# Patient Record
Sex: Male | Born: 1945 | Race: White | Hispanic: No | Marital: Married | Smoking: Never smoker
Health system: Southern US, Community
[De-identification: ages and names within clinical notes are randomized; demographics above are authoritative.]

## PROBLEM LIST (undated history)

## (undated) DIAGNOSIS — J45909 Unspecified asthma, uncomplicated: Secondary | ICD-10-CM

## (undated) DIAGNOSIS — M1990 Unspecified osteoarthritis, unspecified site: Secondary | ICD-10-CM

## (undated) DIAGNOSIS — C4491 Basal cell carcinoma of skin, unspecified: Secondary | ICD-10-CM

## (undated) DIAGNOSIS — L0390 Cellulitis, unspecified: Secondary | ICD-10-CM

## (undated) DIAGNOSIS — E782 Mixed hyperlipidemia: Secondary | ICD-10-CM

## (undated) DIAGNOSIS — I10 Essential (primary) hypertension: Secondary | ICD-10-CM

## (undated) DIAGNOSIS — M791 Myalgia, unspecified site: Secondary | ICD-10-CM

## (undated) DIAGNOSIS — M48062 Spinal stenosis, lumbar region with neurogenic claudication: Secondary | ICD-10-CM

## (undated) DIAGNOSIS — G8929 Other chronic pain: Secondary | ICD-10-CM

## (undated) DIAGNOSIS — L089 Local infection of the skin and subcutaneous tissue, unspecified: Secondary | ICD-10-CM

## (undated) DIAGNOSIS — E119 Type 2 diabetes mellitus without complications: Secondary | ICD-10-CM

## (undated) HISTORY — PX: PILONIDAL CYST EXCISION: SHX744

## (undated) HISTORY — PX: HERNIA REPAIR: SHX51

## (undated) HISTORY — PX: TOTAL HIP ARTHROPLASTY: SHX124

## (undated) HISTORY — PX: TONSILLECTOMY: SUR1361

## (undated) HISTORY — PX: COLONOSCOPY: SHX174

---

## 2004-03-21 ENCOUNTER — Inpatient Hospital Stay: Payer: Self-pay | Admitting: Internal Medicine

## 2004-03-21 ENCOUNTER — Other Ambulatory Visit: Payer: Self-pay

## 2005-09-01 ENCOUNTER — Ambulatory Visit: Payer: Self-pay

## 2006-01-03 ENCOUNTER — Ambulatory Visit: Payer: Self-pay | Admitting: Internal Medicine

## 2009-02-21 ENCOUNTER — Ambulatory Visit: Payer: Self-pay | Admitting: Orthopedic Surgery

## 2009-03-06 ENCOUNTER — Ambulatory Visit: Payer: Self-pay | Admitting: Unknown Physician Specialty

## 2009-05-13 ENCOUNTER — Ambulatory Visit: Payer: Self-pay | Admitting: Unknown Physician Specialty

## 2009-05-19 ENCOUNTER — Inpatient Hospital Stay: Payer: Self-pay | Admitting: Unknown Physician Specialty

## 2010-07-21 IMAGING — RF DG ASPIRATION/ INJ LARGE JOINT
1 series · 1 of 1 positions shown · non-contrast
Comparison: none

REASON FOR EXAM: right hip pain
COMMENTS:

PROCEDURE:     FL  - FL HIP INJECTION  - March 06, 2009  [DATE]
RESULT:     Indication: Right hip pain
Comparisons: No comparison
TECHNIQUE: After informed consent was obtained explaining the risks, benefits, and
possible complications of the procedure, the patient was placed supine on
the fluoroscopy table. A formal time procedure was performed according to
department of protocol.

[Series 1: run · 1 of 1 slices shown]
[im 1/1]
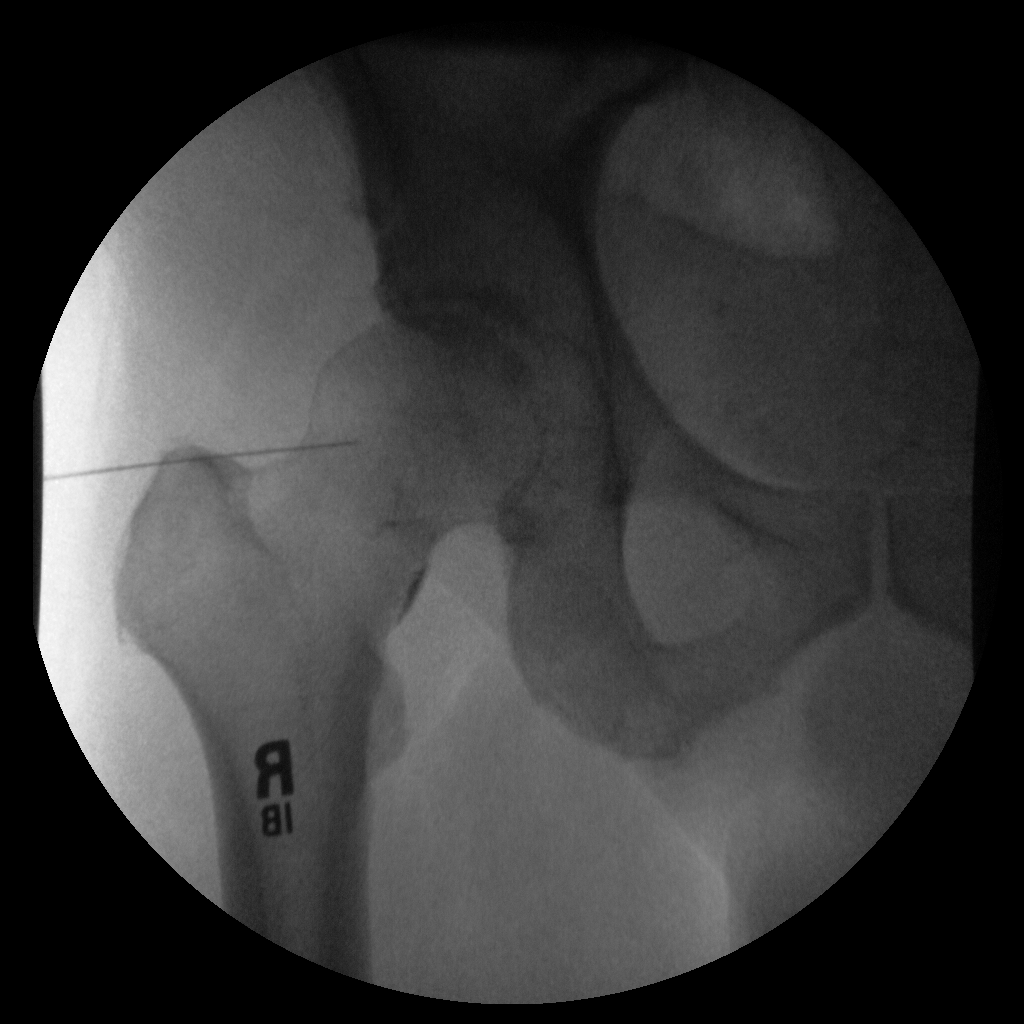

[1 of 1 positions shown; findings below may reference images not displayed]

The right hip was prepped and draped in the usual sterile fashion. 1%
lidocaine was used to anesthetize the skin. A 22 gauge spinal needle was
advanced into the joint space under fluoroscopic guidance. Less than 1ml of
Sptiray-S2P was administered to confirm intra-articular position under
fluoroscopic guidance.

Subsequently, 7 ml of Naropin and 1ml (40 mg) of Kenalog were injected into
the right hip joint space.

Hemostasis was achieved. Patient tolerated the procedure well. There were no
immediate complications.
IMPRESSION: Successful right hip injection as above.

## 2010-10-03 IMAGING — CR RIGHT HIP - 1 VIEW
1 series · 1 of 1 positions shown · non-contrast
Comparison: none

REASON FOR EXAM: post op THR
COMMENTS:   Bedside (portable):Y

[view not recorded]
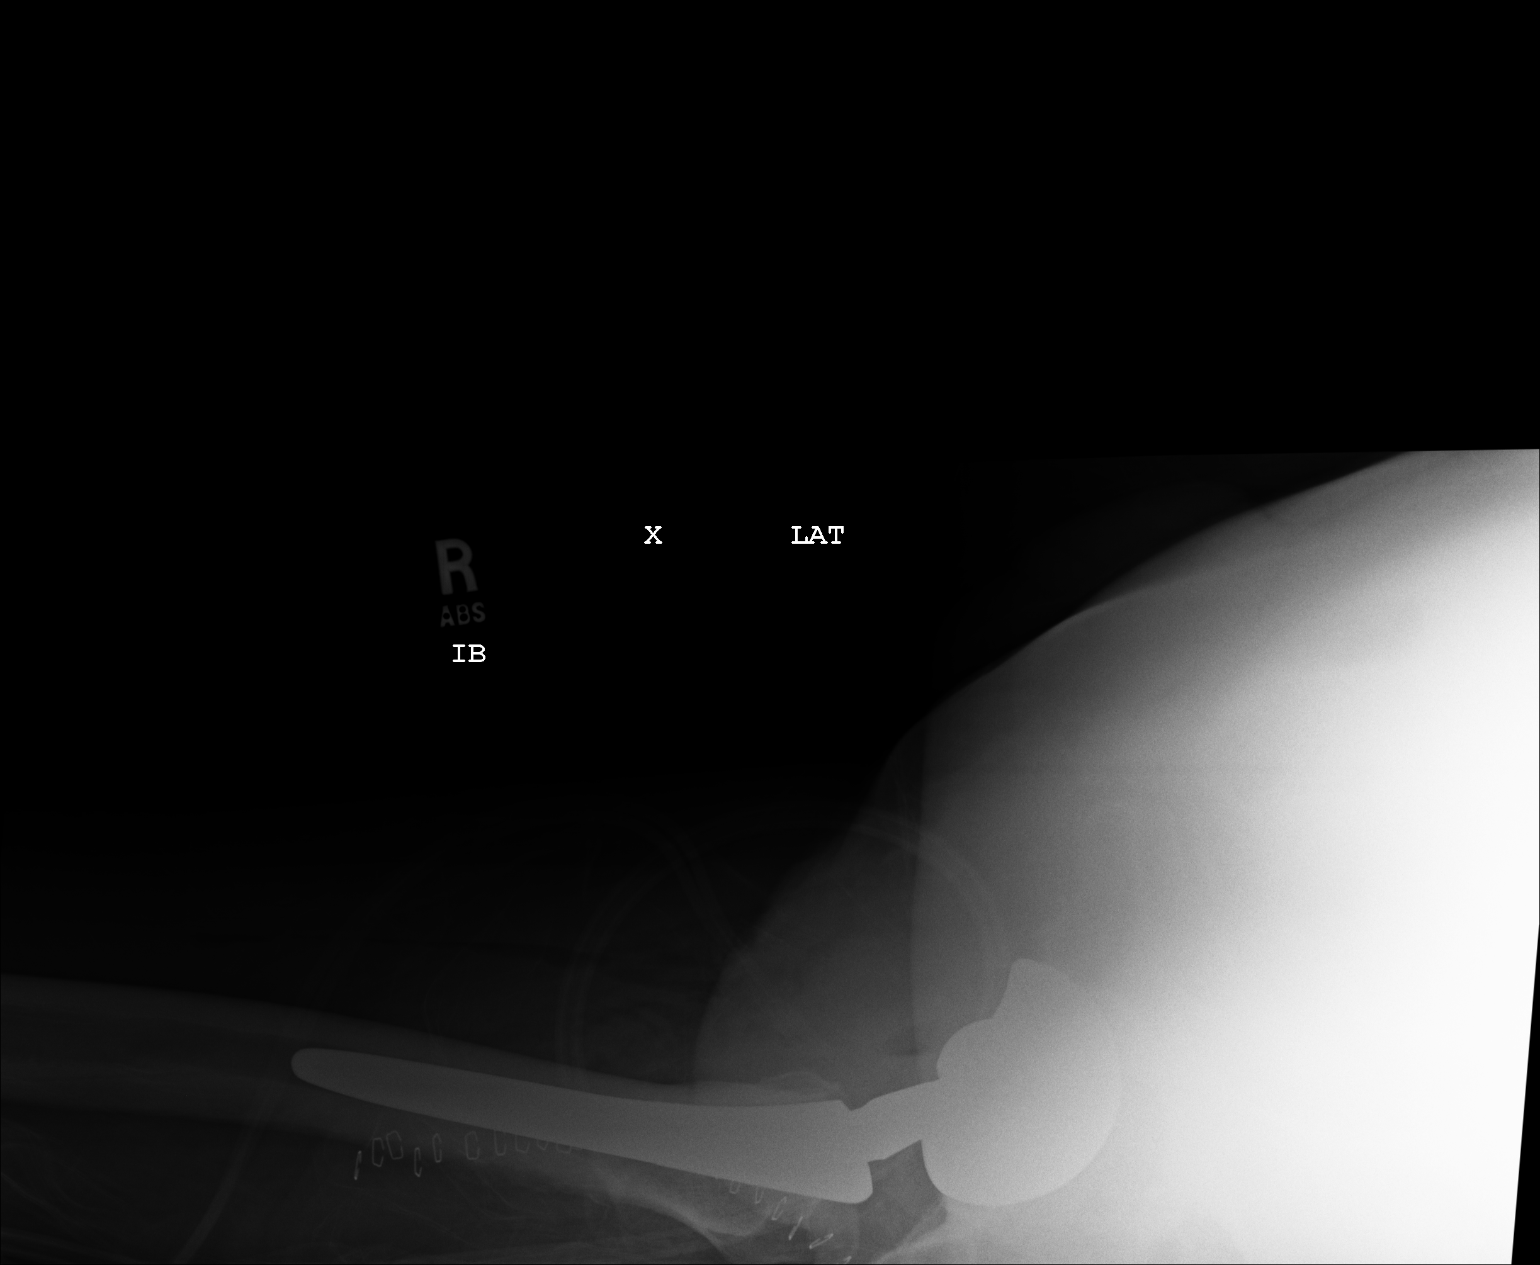

[1 of 1 positions shown; findings below may reference images not displayed]

PROCEDURE:     DXR - DXR HIP RIGHT ONE VIEW  - May 19, 2009 [DATE]

RESULT:     A crosstable lateral view of the right hip reveals that the
patient has undergone prosthetic joint placement. Radiographic positioning
of the prosthetic components is good. Orthopedic drain lines and skin
staples are present.
IMPRESSION: The patient has undergone right hip joint replacement.
Further interpretation is deferred to Dr. Sadiel Jose.

## 2013-03-22 ENCOUNTER — Ambulatory Visit: Payer: Self-pay | Admitting: Gastroenterology

## 2016-03-07 ENCOUNTER — Encounter (INDEPENDENT_AMBULATORY_CARE_PROVIDER_SITE_OTHER): Payer: Medicare Other | Admitting: Ophthalmology

## 2016-03-07 DIAGNOSIS — E113293 Type 2 diabetes mellitus with mild nonproliferative diabetic retinopathy without macular edema, bilateral: Secondary | ICD-10-CM

## 2016-03-07 DIAGNOSIS — I10 Essential (primary) hypertension: Secondary | ICD-10-CM

## 2016-03-07 DIAGNOSIS — H35033 Hypertensive retinopathy, bilateral: Secondary | ICD-10-CM

## 2016-03-07 DIAGNOSIS — E11319 Type 2 diabetes mellitus with unspecified diabetic retinopathy without macular edema: Secondary | ICD-10-CM | POA: Diagnosis not present

## 2016-03-07 DIAGNOSIS — H2513 Age-related nuclear cataract, bilateral: Secondary | ICD-10-CM | POA: Diagnosis not present

## 2016-03-07 DIAGNOSIS — H35371 Puckering of macula, right eye: Secondary | ICD-10-CM

## 2016-03-07 DIAGNOSIS — H43813 Vitreous degeneration, bilateral: Secondary | ICD-10-CM | POA: Diagnosis not present

## 2016-09-12 ENCOUNTER — Ambulatory Visit (INDEPENDENT_AMBULATORY_CARE_PROVIDER_SITE_OTHER): Payer: Medicare Other | Admitting: Ophthalmology

## 2016-09-26 ENCOUNTER — Ambulatory Visit (INDEPENDENT_AMBULATORY_CARE_PROVIDER_SITE_OTHER): Payer: Medicare Other | Admitting: Ophthalmology

## 2019-10-04 ENCOUNTER — Other Ambulatory Visit: Payer: Self-pay | Admitting: Physical Medicine & Rehabilitation

## 2019-10-04 DIAGNOSIS — M5416 Radiculopathy, lumbar region: Secondary | ICD-10-CM

## 2019-10-16 ENCOUNTER — Ambulatory Visit
Admission: RE | Admit: 2019-10-16 | Discharge: 2019-10-16 | Disposition: A | Discharge status: Home / Self Care | Payer: Medicare Other | Source: Ambulatory Visit | Attending: Physical Medicine & Rehabilitation | Admitting: Physical Medicine & Rehabilitation

## 2019-10-16 ENCOUNTER — Other Ambulatory Visit: Payer: Self-pay

## 2019-10-16 DIAGNOSIS — M5416 Radiculopathy, lumbar region: Secondary | ICD-10-CM | POA: Insufficient documentation

## 2021-01-27 ENCOUNTER — Other Ambulatory Visit: Payer: Self-pay | Admitting: Physical Medicine & Rehabilitation

## 2021-01-27 DIAGNOSIS — M48062 Spinal stenosis, lumbar region with neurogenic claudication: Secondary | ICD-10-CM

## 2021-02-09 ENCOUNTER — Other Ambulatory Visit: Payer: Self-pay

## 2021-02-09 ENCOUNTER — Ambulatory Visit
Admission: RE | Admit: 2021-02-09 | Discharge: 2021-02-09 | Disposition: A | Discharge status: Home / Self Care | Payer: Medicare Other | Source: Ambulatory Visit | Attending: Physical Medicine & Rehabilitation | Admitting: Physical Medicine & Rehabilitation

## 2021-02-09 DIAGNOSIS — M48062 Spinal stenosis, lumbar region with neurogenic claudication: Secondary | ICD-10-CM | POA: Diagnosis not present

## 2021-03-25 ENCOUNTER — Other Ambulatory Visit: Payer: Self-pay | Admitting: Neurosurgery

## 2021-04-07 ENCOUNTER — Other Ambulatory Visit: Payer: Self-pay

## 2021-04-07 ENCOUNTER — Other Ambulatory Visit
Admission: RE | Admit: 2021-04-07 | Discharge: 2021-04-07 | Disposition: A | Discharge status: Home / Self Care | Payer: Medicare Other | Source: Ambulatory Visit | Attending: Neurosurgery | Admitting: Neurosurgery

## 2021-04-07 DIAGNOSIS — Z01818 Encounter for other preprocedural examination: Secondary | ICD-10-CM | POA: Insufficient documentation

## 2021-04-07 DIAGNOSIS — I10 Essential (primary) hypertension: Secondary | ICD-10-CM | POA: Diagnosis not present

## 2021-04-07 DIAGNOSIS — Z01810 Encounter for preprocedural cardiovascular examination: Secondary | ICD-10-CM | POA: Diagnosis not present

## 2021-04-07 DIAGNOSIS — M48062 Spinal stenosis, lumbar region with neurogenic claudication: Secondary | ICD-10-CM | POA: Diagnosis not present

## 2021-04-07 DIAGNOSIS — Z79899 Other long term (current) drug therapy: Secondary | ICD-10-CM | POA: Insufficient documentation

## 2021-04-07 DIAGNOSIS — E119 Type 2 diabetes mellitus without complications: Secondary | ICD-10-CM | POA: Insufficient documentation

## 2021-04-07 DIAGNOSIS — Z7984 Long term (current) use of oral hypoglycemic drugs: Secondary | ICD-10-CM | POA: Diagnosis not present

## 2021-04-07 HISTORY — DX: Cellulitis, unspecified: L03.90

## 2021-04-07 HISTORY — DX: Spinal stenosis, lumbar region with neurogenic claudication: M48.062

## 2021-04-07 HISTORY — DX: Other chronic pain: G89.29

## 2021-04-07 HISTORY — DX: Basal cell carcinoma of skin, unspecified: C44.91

## 2021-04-07 HISTORY — DX: Myalgia, unspecified site: M79.10

## 2021-04-07 HISTORY — DX: Mixed hyperlipidemia: E78.2

## 2021-04-07 HISTORY — DX: Essential (primary) hypertension: I10

## 2021-04-07 HISTORY — DX: Local infection of the skin and subcutaneous tissue, unspecified: L08.9

## 2021-04-07 HISTORY — DX: Type 2 diabetes mellitus without complications: E11.9

## 2021-04-07 LAB — URINALYSIS, ROUTINE W REFLEX MICROSCOPIC
Bilirubin Urine: NEGATIVE
Glucose, UA: 50 mg/dL — AB
Hgb urine dipstick: NEGATIVE
Ketones, ur: NEGATIVE mg/dL
Leukocytes,Ua: NEGATIVE
Nitrite: NEGATIVE
Protein, ur: NEGATIVE mg/dL
Specific Gravity, Urine: 1.014 (ref 1.005–1.030)
pH: 6 (ref 5.0–8.0)

## 2021-04-07 LAB — BASIC METABOLIC PANEL
Anion gap: 9 (ref 5–15)
BUN: 17 mg/dL (ref 8–23)
CO2: 30 mmol/L (ref 22–32)
Calcium: 9.2 mg/dL (ref 8.9–10.3)
Chloride: 101 mmol/L (ref 98–111)
Creatinine, Ser: 0.93 mg/dL (ref 0.61–1.24)
GFR, Estimated: >60 mL/min (ref >60)
Glucose, Bld: 114 mg/dL — ABNORMAL HIGH (ref 70–99)
Potassium: 4 mmol/L (ref 3.5–5.1)
Sodium: 140 mmol/L (ref 135–145)

## 2021-04-07 LAB — CBC
HCT: 40.6 % (ref 39.0–52.0)
Hemoglobin: 13.8 g/dL (ref 13.0–17.0)
MCH: 31.2 pg (ref 26.0–34.0)
MCHC: 34 g/dL (ref 30.0–36.0)
MCV: 91.6 fL (ref 80.0–100.0)
Platelets: 198 10*3/uL (ref 150–400)
RBC: 4.43 MIL/uL (ref 4.22–5.81)
RDW: 12.5 % (ref 11.5–15.5)
WBC: 4.7 10*3/uL (ref 4.0–10.5)
nRBC: 0 % (ref 0.0–0.2)

## 2021-04-07 LAB — APTT: aPTT: 25 seconds (ref 24–36)

## 2021-04-07 LAB — SURGICAL PCR SCREEN
MRSA, PCR: NEGATIVE
Staphylococcus aureus: NEGATIVE

## 2021-04-07 LAB — TYPE AND SCREEN
ABO/RH(D): O POS
Antibody Screen: NEGATIVE

## 2021-04-07 LAB — PROTIME-INR
INR: 1.1 (ref 0.8–1.2)
Prothrombin Time: 13.7 seconds (ref 11.4–15.2)

## 2021-04-07 NOTE — Patient Instructions (Signed)
Your procedure is scheduled on: 04/14/21 Report to [facility]. To find out your arrival time please call [phone] between 1PM - 3PM on 04/13/21.  Remember: Instructions that are not followed completely may result in serious medical risk, up to and including death, or upon the discretion of your surgeon and anesthesiologist your surgery may need to be rescheduled.     _X__ 1. Do not eat food after midnight the night before your procedure.                 No gum chewing or hard candies. You may drink clear liquids up to 2 hours                 before you are scheduled to arrive for your surgery  Diabetics water only  __X__2.  On the morning of surgery brush your teeth with toothpaste and water, you                 may rinse your mouth with mouthwash if you wish.  Do not swallow any              toothpaste of mouthwash.     _X__ 3.  No Alcohol for 24 hours before or after surgery.   _X__ 4.  Do Not Smoke or use e-cigarettes For 24 Hours Prior to Your Surgery.                 Do not use any chewable tobacco products for at least 6 hours prior to                 surgery.  ____  5.  Bring all medications with you on the day of surgery if instructed.   __X__  6.  Notify your doctor if there is any change in your medical condition      (cold, fever, infections).     Do not wear jewelry, make-up, hairpins, clips or nail polish. Do not wear lotions, powders, or perfumes.  Do not shave body hair 48 hours prior to surgery. Men may shave face and neck. Do not bring valuables to the hospital.    [facility] is not responsible for any belongings or valuables.  Contacts, dentures/partials or body piercings may not be worn into surgery. Bring a case for your contacts, glasses or hearing aids, a denture cup will be supplied. Leave your suitcase in the car. After surgery it may be brought to your room. For patients admitted to the hospital,  discharge time is determined by your treatment team.   Patients discharged the day of surgery will not be allowed to drive home.   Please read over the following fact sheets that you were given:   MRSA Information, CHG soap, Spine guide  __X__ Take these medicines the morning of surgery with A SIP OF WATER:    1. ezetimibe (ZETIA) 10 MG tablet  2.   3.   4.  5.  6.  ____ Fleet Enema (as directed)   __X__ Use CHG Soap/SAGE wipes as directed  ____ Use inhalers on the day of surgery  ____ Stop metformin/Janumet/Farxiga 2 days prior to surgery    ____ Take 1/2 of usual insulin dose the night before surgery. No insulin the morning          of surgery.   ____ Stop Blood Thinners Coumadin/Plavix/Xarelto/Pleta/Pradaxa/Eliquis/Effient/Aspirin  on   Or contact your Surgeon, Cardiologist or Medical Doctor  regarding  ability to stop your blood thinners  __X__ Stop Anti-inflammatories 7 days before surgery such as Advil, Ibuprofen, Motrin,  BC or Goodies Powder, Naprosyn, Naproxen, Aleve, Aspirin    __X__ Stop all herbal supplements, fish oil or vitamin E until after surgery. HOLD RED YEAST RICE AND BIO-FISITIN STARTING TODAY   ____ Bring C-Pap to the hospital.

## 2021-04-14 ENCOUNTER — Encounter: Payer: Self-pay | Admitting: Neurosurgery

## 2021-04-14 ENCOUNTER — Ambulatory Visit: Payer: Medicare Other

## 2021-04-14 ENCOUNTER — Ambulatory Visit
Admission: RE | Admit: 2021-04-14 | Discharge: 2021-04-14 | Disposition: A | Discharge status: Home / Self Care | Payer: Medicare Other | Attending: Neurosurgery | Admitting: Neurosurgery

## 2021-04-14 ENCOUNTER — Ambulatory Visit: Payer: Medicare Other | Admitting: Anesthesiology

## 2021-04-14 ENCOUNTER — Encounter
Admission: RE | Disposition: A | Discharge status: Home / Self Care | Payer: Self-pay | Source: Home / Self Care | Attending: Neurosurgery

## 2021-04-14 ENCOUNTER — Ambulatory Visit: Payer: Medicare Other | Admitting: Urgent Care

## 2021-04-14 ENCOUNTER — Other Ambulatory Visit: Payer: Self-pay

## 2021-04-14 DIAGNOSIS — M47816 Spondylosis without myelopathy or radiculopathy, lumbar region: Secondary | ICD-10-CM | POA: Insufficient documentation

## 2021-04-14 DIAGNOSIS — E119 Type 2 diabetes mellitus without complications: Secondary | ICD-10-CM | POA: Insufficient documentation

## 2021-04-14 DIAGNOSIS — M48062 Spinal stenosis, lumbar region with neurogenic claudication: Secondary | ICD-10-CM | POA: Diagnosis present

## 2021-04-14 DIAGNOSIS — M4807 Spinal stenosis, lumbosacral region: Secondary | ICD-10-CM | POA: Insufficient documentation

## 2021-04-14 DIAGNOSIS — G9609 Other spinal cerebrospinal fluid leak: Secondary | ICD-10-CM | POA: Insufficient documentation

## 2021-04-14 DIAGNOSIS — Z419 Encounter for procedure for purposes other than remedying health state, unspecified: Secondary | ICD-10-CM

## 2021-04-14 HISTORY — PX: LUMBAR LAMINECTOMY/DECOMPRESSION MICRODISCECTOMY: SHX5026

## 2021-04-14 LAB — ABO/RH: ABO/RH(D): O POS

## 2021-04-14 LAB — GLUCOSE, CAPILLARY
Glucose-Capillary: 142 mg/dL — ABNORMAL HIGH (ref 70–99)
Glucose-Capillary: 168 mg/dL — ABNORMAL HIGH (ref 70–99)

## 2021-04-14 SURGERY — LUMBAR LAMINECTOMY/DECOMPRESSION MICRODISCECTOMY 2 LEVELS
Anesthesia: General

## 2021-04-14 MED ORDER — BUPIVACAINE-EPINEPHRINE (PF) 0.5% -1:200000 IJ SOLN
INTRAMUSCULAR | Status: DC | PRN
  Administered 2021-04-14: 5 mL

## 2021-04-14 MED ORDER — METHYLPREDNISOLONE ACETATE 40 MG/ML IJ SUSP
INTRAMUSCULAR | Status: DC | PRN
  Administered 2021-04-14: 40 mg

## 2021-04-14 MED ORDER — FIBRIN SEALANT 2 ML SINGLE DOSE KIT
PACK | CUTANEOUS | Status: DC | PRN
  Administered 2021-04-14: 2 mL via TOPICAL

## 2021-04-14 MED ORDER — REMIFENTANIL HCL 1 MG IV SOLR
INTRAVENOUS | Status: DC | PRN
  Administered 2021-04-14: .1 ug/kg/min via INTRAVENOUS

## 2021-04-14 MED ORDER — PROPOFOL 10 MG/ML IV BOLUS
INTRAVENOUS | Status: DC | PRN
  Administered 2021-04-14: 150 mg via INTRAVENOUS

## 2021-04-14 MED ORDER — SENNA 8.6 MG PO TABS: 1.0000 | ORAL_TABLET | Freq: Every day | ORAL | 0 refills | Status: DC | PRN

## 2021-04-14 MED ORDER — PHENYLEPHRINE HCL (PRESSORS) 10 MG/ML IV SOLN
INTRAVENOUS | Status: DC | PRN
  Administered 2021-04-14: 100 ug via INTRAVENOUS

## 2021-04-14 MED ORDER — ACETAMINOPHEN 10 MG/ML IV SOLN
INTRAVENOUS | Status: AC
  Filled 2021-04-14: qty 100

## 2021-04-14 MED ORDER — CEFAZOLIN SODIUM-DEXTROSE 2-4 GM/100ML-% IV SOLN
2.0000 g | Freq: Once | INTRAVENOUS | Status: AC
  Administered 2021-04-14: 2 g via INTRAVENOUS

## 2021-04-14 MED ORDER — CEFAZOLIN SODIUM-DEXTROSE 2-4 GM/100ML-% IV SOLN
INTRAVENOUS | Status: AC
  Filled 2021-04-14: qty 100

## 2021-04-14 MED ORDER — ORAL CARE MOUTH RINSE: 15.0000 mL | Freq: Once | OROMUCOSAL | Status: AC

## 2021-04-14 MED ORDER — ROCURONIUM BROMIDE 100 MG/10ML IV SOLN
INTRAVENOUS | Status: DC | PRN
  Administered 2021-04-14 (×2): 10 mg via INTRAVENOUS

## 2021-04-14 MED ORDER — HEMOSTATIC AGENTS (NO CHARGE) OPTIME
TOPICAL | Status: DC | PRN
  Administered 2021-04-14: 1 via TOPICAL

## 2021-04-14 MED ORDER — DEXAMETHASONE SODIUM PHOSPHATE 10 MG/ML IJ SOLN
INTRAMUSCULAR | Status: DC | PRN
  Administered 2021-04-14: 10 mg via INTRAVENOUS

## 2021-04-14 MED ORDER — FAMOTIDINE 20 MG PO TABS
20.0000 mg | ORAL_TABLET | Freq: Once | ORAL | Status: AC
  Administered 2021-04-14: 20 mg via ORAL

## 2021-04-14 MED ORDER — EPHEDRINE SULFATE 50 MG/ML IJ SOLN
INTRAMUSCULAR | Status: DC | PRN
  Administered 2021-04-14 (×2): 5 mg via INTRAVENOUS

## 2021-04-14 MED ORDER — FENTANYL CITRATE (PF) 100 MCG/2ML IJ SOLN
INTRAMUSCULAR | Status: DC | PRN
  Administered 2021-04-14: 50 ug via INTRAVENOUS

## 2021-04-14 MED ORDER — SODIUM CHLORIDE 0.9 % IV SOLN
INTRAVENOUS | Status: DC | PRN
  Administered 2021-04-14: 40 mL

## 2021-04-14 MED ORDER — CHLORHEXIDINE GLUCONATE 0.12 % MT SOLN
15.0000 mL | Freq: Once | OROMUCOSAL | Status: AC
  Administered 2021-04-14: 15 mL via OROMUCOSAL

## 2021-04-14 MED ORDER — LIDOCAINE HCL (CARDIAC) PF 100 MG/5ML IV SOSY
PREFILLED_SYRINGE | INTRAVENOUS | Status: DC | PRN
  Administered 2021-04-14: 100 mg via INTRAVENOUS

## 2021-04-14 MED ORDER — PROPOFOL 10 MG/ML IV BOLUS
INTRAVENOUS | Status: AC
  Filled 2021-04-14: qty 20

## 2021-04-14 MED ORDER — REMIFENTANIL HCL 1 MG IV SOLR
INTRAVENOUS | Status: AC
  Filled 2021-04-14: qty 1000

## 2021-04-14 MED ORDER — ONDANSETRON HCL 4 MG/2ML IJ SOLN
INTRAMUSCULAR | Status: DC | PRN
  Administered 2021-04-14 (×2): 4 mg via INTRAVENOUS

## 2021-04-14 MED ORDER — CHLORHEXIDINE GLUCONATE 0.12 % MT SOLN
OROMUCOSAL | Status: AC
  Filled 2021-04-14: qty 15

## 2021-04-14 MED ORDER — SUCCINYLCHOLINE CHLORIDE 200 MG/10ML IV SOSY
PREFILLED_SYRINGE | INTRAVENOUS | Status: DC | PRN
  Administered 2021-04-14: 100 mg via INTRAVENOUS

## 2021-04-14 MED ORDER — LACTATED RINGERS IV SOLN: INTRAVENOUS | Status: DC

## 2021-04-14 MED ORDER — BUPIVACAINE HCL (PF) 0.5 % IJ SOLN
INTRAMUSCULAR | Status: DC | PRN
  Administered 2021-04-14: 20 mL

## 2021-04-14 MED ORDER — MIDAZOLAM HCL 2 MG/2ML IJ SOLN
INTRAMUSCULAR | Status: AC
  Filled 2021-04-14: qty 2

## 2021-04-14 MED ORDER — GLYCOPYRROLATE 0.2 MG/ML IJ SOLN
INTRAMUSCULAR | Status: DC | PRN
  Administered 2021-04-14: .2 mg via INTRAVENOUS

## 2021-04-14 MED ORDER — ACETAMINOPHEN 10 MG/ML IV SOLN
INTRAVENOUS | Status: DC | PRN
  Administered 2021-04-14: 1000 mg via INTRAVENOUS

## 2021-04-14 MED ORDER — 0.9 % SODIUM CHLORIDE (POUR BTL) OPTIME
TOPICAL | Status: DC | PRN
  Administered 2021-04-14: 1000 mL

## 2021-04-14 MED ORDER — PHENYLEPHRINE HCL-NACL 20-0.9 MG/250ML-% IV SOLN
INTRAVENOUS | Status: DC | PRN
  Administered 2021-04-14: 30 ug/min via INTRAVENOUS

## 2021-04-14 MED ORDER — FAMOTIDINE 20 MG PO TABS
ORAL_TABLET | ORAL | Status: AC
  Filled 2021-04-14: qty 1

## 2021-04-14 MED ORDER — SODIUM CHLORIDE FLUSH 0.9 % IV SOLN
INTRAVENOUS | Status: AC
  Filled 2021-04-14: qty 10

## 2021-04-14 MED ORDER — PROPOFOL 1000 MG/100ML IV EMUL
INTRAVENOUS | Status: AC
  Filled 2021-04-14: qty 100

## 2021-04-14 MED ORDER — HYDROCODONE-ACETAMINOPHEN 5-325 MG PO TABS: 1.0000 | ORAL_TABLET | Freq: Four times a day (QID) | ORAL | 0 refills | Status: AC | PRN

## 2021-04-14 MED ORDER — METHOCARBAMOL 500 MG PO TABS: 500.0000 mg | ORAL_TABLET | Freq: Three times a day (TID) | ORAL | 0 refills | Status: DC

## 2021-04-14 MED ORDER — SUGAMMADEX SODIUM 200 MG/2ML IV SOLN
INTRAVENOUS | Status: DC | PRN
  Administered 2021-04-14: 100 mg via INTRAVENOUS

## 2021-04-14 MED ORDER — FENTANYL CITRATE (PF) 100 MCG/2ML IJ SOLN
INTRAMUSCULAR | Status: AC
  Filled 2021-04-14: qty 2

## 2021-04-14 SURGICAL SUPPLY — 59 items
ADH SKN CLS APL DERMABOND .7 (GAUZE/BANDAGES/DRESSINGS)
AGENT HMST KT MTR STRL THRMB (HEMOSTASIS) ×1
APL PRP STRL LF DISP 70% ISPRP (MISCELLANEOUS) ×2
BUR NEURO DRILL SOFT 3.0X3.8M (BURR) ×2 IMPLANT
CHLORAPREP W/TINT 26 (MISCELLANEOUS) ×3 IMPLANT
CNTNR SPEC 2.5X3XGRAD LEK (MISCELLANEOUS) ×1
CONT SPEC 4OZ STER OR WHT (MISCELLANEOUS) ×1
CONT SPEC 4OZ STRL OR WHT (MISCELLANEOUS) ×1
CONTAINER SPEC 2.5X3XGRAD LEK (MISCELLANEOUS) ×1 IMPLANT
COUNTER NEEDLE 20/40 LG (NEEDLE) ×2 IMPLANT
CUP MEDICINE 2OZ PLAST GRAD ST (MISCELLANEOUS) ×2 IMPLANT
DERMABOND ADVANCED (GAUZE/BANDAGES/DRESSINGS)
DERMABOND ADVANCED .7 DNX12 (GAUZE/BANDAGES/DRESSINGS) ×1 IMPLANT
DRAPE C ARM PK CFD 31 SPINE (DRAPES) ×2 IMPLANT
DRAPE LAPAROTOMY 100X77 ABD (DRAPES) ×2 IMPLANT
DRAPE MICROSCOPE SPINE 48X150 (DRAPES) ×2 IMPLANT
DRAPE SURG 17X11 SM STRL (DRAPES) ×5 IMPLANT
DRSG TEGADERM 4X4.75 (GAUZE/BANDAGES/DRESSINGS) ×1 IMPLANT
DRSG TELFA 4X3 1S NADH ST (GAUZE/BANDAGES/DRESSINGS) ×1 IMPLANT
ELECT CAUTERY BLADE TIP 2.5 (TIP) ×2
ELECT EZSTD 165MM 6.5IN (MISCELLANEOUS) ×2
ELECT REM PT RETURN 9FT ADLT (ELECTROSURGICAL) ×2
ELECTRODE CAUTERY BLDE TIP 2.5 (TIP) ×1 IMPLANT
ELECTRODE EZSTD 165MM 6.5IN (MISCELLANEOUS) ×1 IMPLANT
ELECTRODE REM PT RTRN 9FT ADLT (ELECTROSURGICAL) ×1 IMPLANT
GAUZE 4X4 16PLY ~~LOC~~+RFID DBL (SPONGE) ×2 IMPLANT
GAUZE XEROFORM 1X8 LF (GAUZE/BANDAGES/DRESSINGS) ×1 IMPLANT
GLOVE SURG SYN 6.5 ES PF (GLOVE) ×2 IMPLANT
GLOVE SURG SYN 6.5 PF PI (GLOVE) ×1 IMPLANT
GLOVE SURG SYN 8.5  E (GLOVE) ×3
GLOVE SURG SYN 8.5 E (GLOVE) ×3 IMPLANT
GLOVE SURG SYN 8.5 PF PI (GLOVE) ×3 IMPLANT
GLOVE SURG UNDER POLY LF SZ6.5 (GLOVE) ×2 IMPLANT
GOWN SRG LRG LVL 4 IMPRV REINF (GOWNS) ×1 IMPLANT
GOWN SRG XL LVL 3 NONREINFORCE (GOWNS) ×1 IMPLANT
GOWN STRL NON-REIN TWL XL LVL3 (GOWNS) ×2
GOWN STRL REIN LRG LVL4 (GOWNS) ×2
GRADUATE 1200CC STRL 31836 (MISCELLANEOUS) ×2 IMPLANT
GRAFT DURAGEN MATRIX 1WX1L (Tissue) ×1 IMPLANT
KIT SPINAL PRONEVIEW (KITS) ×2 IMPLANT
MANIFOLD NEPTUNE II (INSTRUMENTS) ×2 IMPLANT
MARKER SKIN DUAL TIP RULER LAB (MISCELLANEOUS) ×4 IMPLANT
NDL SAFETY ECLIPSE 18X1.5 (NEEDLE) ×1 IMPLANT
NEEDLE HYPO 18GX1.5 SHARP (NEEDLE) ×2
NEEDLE HYPO 22GX1.5 SAFETY (NEEDLE) ×2 IMPLANT
NS IRRIG 1000ML POUR BTL (IV SOLUTION) ×2 IMPLANT
PACK LAMINECTOMY NEURO (CUSTOM PROCEDURE TRAY) ×2 IMPLANT
SURGIFLO W/THROMBIN 8M KIT (HEMOSTASIS) ×2 IMPLANT
SUT DVC VLOC 3-0 CL 6 P-12 (SUTURE) ×2 IMPLANT
SUT ETHILON 3-0 FS-10 30 BLK (SUTURE) ×2
SUT VIC AB 0 CT1 27 (SUTURE) ×2
SUT VIC AB 0 CT1 27XCR 8 STRN (SUTURE) ×1 IMPLANT
SUT VIC AB 2-0 CT1 18 (SUTURE) ×3 IMPLANT
SUTURE EHLN 3-0 FS-10 30 BLK (SUTURE) IMPLANT
SYR 30ML LL (SYRINGE) ×4 IMPLANT
SYR 3ML LL SCALE MARK (SYRINGE) ×2 IMPLANT
TOWEL OR 17X26 4PK STRL BLUE (TOWEL DISPOSABLE) ×6 IMPLANT
TUBING CONNECTING 10 (TUBING) ×2 IMPLANT
WATER STERILE IRR 500ML POUR (IV SOLUTION) ×2 IMPLANT

## 2021-04-14 NOTE — Anesthesia Procedure Notes (Signed)
Procedure Name: Intubation Date/Time: 04/14/2021 8:38 AM Performed by: Kelton Pillar, CRNA Pre-anesthesia Checklist: Patient identified, Emergency Drugs available, Suction available and Patient being monitored Patient Re-evaluated:Patient Re-evaluated prior to induction Oxygen Delivery Method: Circle system utilized Preoxygenation: Pre-oxygenation with 100% oxygen Induction Type: IV induction Ventilation: Mask ventilation without difficulty Laryngoscope Size: McGraph and 3 Grade View: Grade I Tube type: Oral Tube size: 6.5 mm Number of attempts: 1 Airway Equipment and Method: Stylet and Oral airway Placement Confirmation: ETT inserted through vocal cords under direct vision, positive ETCO2, breath sounds checked- equal and bilateral and CO2 detector Secured at: 21 cm Tube secured with: Tape Dental Injury: Teeth and Oropharynx as per pre-operative assessment  Comments: 6.5ett elected after Dlx1, narrow vc view. TFH

## 2021-04-14 NOTE — Discharge Summary (Signed)
Physician Discharge Summary  Patient ID: Vincent Small MRN: [id number] DOB/AGE: [birth year] 75 y.o.  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 *   Discharged Condition: good  Hospital Course: RAMI BUDHU is a 75 y.o presenting with lumbar stenosis s/p L4-S1 laminectomy for decompression. His intraoperative course was complicated by a small, low flow CSF leak that was repaired intraoperatively. He was monitored in PACU and ultimately discharged home after ambulating, urinating, and tolerating PO intake. He was given prescriptions for Norco, Robaxin, and Senna  Consults: None  Significant Diagnostic Studies: none  Treatments: surgery: As above. Please see separately dictated operative report for further details  Discharge Exam: Blood pressure (!) 134/96, pulse 97, temperature (!) 97.2 F (36.2 C), temperature source Temporal, resp. rate 18, height 5\' 6"  (1.676 m), weight 76.2 kg, SpO2 96 %. CN II-XII grossly intact Sensation intact throughout bilateral lower extremities 5/5 strength throughout Incision with clean postop dressing in place  Disposition: Discharge disposition: 01-Home or Self Care       Allergies as of 04/14/2021       Reactions   Statins    Muscle Pain        Medication List     TAKE these medications    ezetimibe 10 MG tablet Commonly known as: ZETIA Take 10 mg by mouth daily.   glimepiride 2 MG tablet Commonly known as: AMARYL Take 2 mg by mouth every morning.   hydrochlorothiazide 12.5 MG capsule Commonly known as: MICROZIDE Take 12.5 mg by mouth daily.   HYDROcodone-acetaminophen 5-325 MG tablet Commonly known as: Norco Take 1 tablet by mouth every 6 (six) hours as needed for up to 5 days for severe pain.   losartan 100 MG tablet Commonly known as: COZAAR Take 100 mg by mouth daily.    methocarbamol 500 MG tablet Commonly known as: Robaxin Take 1 tablet (500 mg total) by mouth 3 (three) times daily.   OVER THE COUNTER MEDICATION Take 1 tablet by mouth every evening. bio-fisetin otc supplement   Red Yeast Rice 600 MG Caps Take 1,200 mg by mouth 4 (four) times a week.   senna 8.6 MG Tabs tablet Commonly known as: SENOKOT Take 1 tablet (8.6 mg total) by mouth daily as needed for mild constipation.   timolol 0.5 % ophthalmic solution Commonly known as: BETIMOL Place 1 drop into both eyes daily.        Follow-up Information     Loleta Dicker, PA Follow up in 2 week(s).   Why: For suture removal and incision check. This appointment should already be scheduled through the [facility]. Please call with any questions or concerns regarding appointment date or time  Thursday November 17th, 2022 at 10:30 AM Contact information: [place] [address] [phone]                 Signed: Loleta Dicker 04/14/2021, 4:45 PM

## 2021-04-14 NOTE — Discharge Instructions (Addendum)
Your surgeon has performed an operation on your lumbar spine (low back) to relieve pressure on one or more nerves. Many times, patients feel better immediately after surgery and can "overdo it." Even if you feel well, it is important that you follow these activity guidelines. If you do not let your back heal properly from the surgery, you can increase the chance of a disc herniation and/or return of your symptoms. The following are instructions to help in your recovery once you have been discharged from the hospital.  * Do not take anti-inflammatory medications for 3 days after surgery (naproxen [Aleve], ibuprofen [Advil, Motrin], etc.)  Activity    No bending, lifting, or twisting ("BLT"). Avoid lifting objects heavier than 10 pounds (gallon milk jug).  Where possible, avoid household activities that involve lifting, bending, pushing, or pulling such as laundry, vacuuming, grocery shopping, and childcare. Try to arrange for help from friends and family for these activities while your back heals.  Increase physical activity slowly as tolerated.  Taking short walks is encouraged, but avoid strenuous exercise. Do not jog, run, bicycle, lift weights, or participate in any other exercises unless specifically allowed by your doctor. Avoid prolonged sitting, including car rides.  Talk to your doctor before resuming sexual activity.  You should not drive until cleared by your doctor.  Until released by your doctor, you should not return to work or school.  You should rest at home and let your body heal.   You may shower two days after your surgery.  After showering, lightly dab your incision dry. Do not take a tub bath or go swimming for 3 weeks, or until approved by your doctor at your follow-up appointment.  If you smoke, we strongly recommend that you quit.  Smoking has been proven to interfere with normal healing in your back and will dramatically reduce the success rate of your surgery. Please  contact [facility] (800-QUIT-NOW) and use the resources at www.[facility].com for assistance in stopping smoking.  Surgical Incision   If you have a dressing on your incision, you may remove it two days after your surgery. Keep your incision area clean and dry.  You have stitches on your incision, you should have a follow up scheduled for removal in about 2 weeks.  Diet            You may return to your usual diet. Be sure to stay hydrated.  When to Contact us  Although your surgery and recovery will likely be uneventful, you may have some residual numbness, aches, and pains in your back and/or legs. This is normal and should improve in the next few weeks.  However, should you experience any of the following, contact us immediately: New numbness or weakness Pain that is progressively getting worse, and is not relieved by your pain medications or rest Bleeding, redness, swelling, pain, or drainage from surgical incision Chills or flu-like symptoms Fever greater than 101.0 F (38.3 C) Problems with bowel or bladder functions Difficulty breathing or shortness of breath Warmth, tenderness, or swelling in your calf  Contact Information During office hours (Monday-Friday 9 am to 5 pm), please call your physician at [phone] After hours and weekends, please call [phone] and speak with the answering service, who will contact the doctor on call.  If that fails, call the [facility] Operator at [phone] and ask for the Neurosurgery Resident On Call  For a life-threatening emergency, call [place]   The drugs  that you were given will stay in your system until tomorrow so for the next 24 hours you should not:  Drive an automobile Make any legal decisions Drink any alcoholic beverage   You may resume regular meals tomorrow.  Today it is better to start with liquids and gradually work up to solid foods.  You may eat anything you prefer, but  it is better to start with liquids, then soup and crackers, and gradually work up to solid foods.   Please notify your doctor immediately if you have any unusual bleeding, trouble breathing, redness and pain at the surgery site, drainage, fever, or pain not relieved by medication.    Additional Instructions:  Please contact your physician with any problems or Same Day Surgery at [phone], Monday through Friday 6 am to 4 pm, or [place] at [facility] number at [phone].

## 2021-04-14 NOTE — Anesthesia Postprocedure Evaluation (Signed)
Anesthesia Post Note  Patient: Vincent Small  Procedure(s) Performed: L4-S1 DECOMPRESSION  Patient location during evaluation: PACU Anesthesia Type: General Level of consciousness: awake and alert, oriented and patient cooperative Pain management: pain level controlled Vital Signs Assessment: post-procedure vital signs reviewed and stable Respiratory status: spontaneous breathing, nonlabored ventilation and respiratory function stable Cardiovascular status: blood pressure returned to baseline and stable Postop Assessment: adequate PO intake Anesthetic complications: no   No notable events documented.   Last Vitals:  Vitals:   04/14/21 1130 04/14/21 1145  BP: (!) 151/74 (!) 143/80  Pulse: 77 77  Resp:    Temp:    SpO2: 91% 100%    Last Pain:  Vitals:   04/14/21 1130  TempSrc:   PainSc: 0-No pain                 Darrin Nipper

## 2021-04-14 NOTE — H&P (Signed)
I have reviewed and confirmed my history and physical from 03/25/21 with no additions or changes. Plan for L4-S1 decompression.  Risks and benefits reviewed.  Heart sounds normal no MRG. Chest Clear to Auscultation Bilaterally.

## 2021-04-14 NOTE — Transfer of Care (Signed)
Immediate Anesthesia Transfer of Care Note  Patient: Vincent Small  Procedure(s) Performed: L4-S1 DECOMPRESSION  Patient Location: PACU  Anesthesia Type:General  Level of Consciousness: awake, drowsy and patient cooperative  Airway & Oxygen Therapy: Patient Spontanous Breathing and Patient connected to face mask oxygen  Post-op Assessment: Report given to RN and Post -op Vital signs reviewed and stable  Post vital signs: Reviewed and stable  Last Vitals:  Vitals Value Taken Time  BP 159/74 04/14/21 1101  Temp    Pulse 89 04/14/21 1103  Resp    SpO2 99 % 04/14/21 1103  Vitals shown include unvalidated device data.  Last Pain:  Vitals:   04/14/21 0725  TempSrc: Tympanic  PainSc: 0-No pain      Patients Stated Pain Goal: 0 (79/98/72 1587)  Complications: No notable events documented.

## 2021-04-14 NOTE — Op Note (Signed)
Indications: Mr. Sterling is a 75 yo male who presented with neurogenic claudication.  He failed conservative management prompting surgical intervention.  Findings: severe stenosis  Preoperative Diagnosis: Lumbar Stenosis with neurogenic claudication Postoperative Diagnosis: same   EBL: 20 ml IVF: 600 ml Drains: none Disposition: Extubated and Stable to PACU Complications: none  No foley catheter was placed.   Preoperative Note:   Risks of surgery discussed include: infection, bleeding, stroke, coma, death, paralysis, CSF leak, nerve/spinal cord injury, numbness, tingling, weakness, complex regional pain syndrome, recurrent stenosis and/or disc herniation, vascular injury, development of instability, neck/back pain, need for further surgery, persistent symptoms, development of deformity, and the risks of anesthesia. The patient understood these risks and agreed to proceed.  Operative Note:   1. L4-S1 lumbar decompression including central laminectomy and bilateral medial facetectomies including foraminotomies  The patient was then brought from the preoperative center with intravenous access established.  The patient underwent general anesthesia and endotracheal tube intubation, and was then rotated on the Arthur rail top where all pressure points were appropriately padded.  The skin was then thoroughly cleansed.  Perioperative antibiotic prophylaxis was administered.  Sterile prep and drapes were then applied and a timeout was then observed.  C-arm was brought into the field under sterile conditions and under lateral visualization the L5-S1 interspace was identified and marked.  The incision was marked on the right and injected with local anesthetic. Once this was complete a 4 cm incision was opened with the use of a #10 blade knife.    The metrx tubes were sequentially advanced and confirmed in position at L5-S1. An 72mm by 31mm tube was locked in place to the bed side attachment.  The  microscope was then sterilely brought into the field and muscle creep was hemostased with a bipolar and resected with a pituitary rongeur.  A Bovie extender was then used to expose the spinous process and lamina.  Careful attention was placed to not violate the facet capsule. A 3 mm matchstick drill bit was then used to make a hemi-laminotomy trough until the ligamentum flavum was exposed.  This was extended to the base of the spinous process and to the contralateral side to remove all the central bone from each side.  Once this was complete and the underlying ligamentum flavum was visualized, it was dissected with a curette and resected with Kerrison rongeurs.  Extensive ligamentum hypertrophy was noted, requiring a substantial amount of time and care for removal.  The dura was identified and palpated. The kerrison rongeur was then used to remove the medial facet bilaterally until no compression was noted.  A balltip probe was used to confirm decompression of the ipsilateral S1 nerve root.  Additional attention was paid to completion of the contralateral L5-S1 foraminotomy until the contralateral traversing nerve root was completely free.  Once this was complete, L5-S1 central decompression including medial facetectomy and foraminotomy was confirmed and decompression on both sides was confirmed. No CSF leak was noted.  A Depo-Medrol soaked Gelfoam pledget was placed in the defect.  The wound was copiously irrigated. The tube system was then removed under microscopic visualization  and hemostasis was obtained with a bipolar.    After performing the decompression at L5-S1, the metrx tubes were sequentially advanced and confirmed in position at L4-5. An 55mm by 45mm tube was locked in place to the bed side attachment.  Fluoroscopy was then removed from the field.  The microscope was then sterilely brought into the field and muscle  creep was hemostased with a bipolar and resected with a pituitary rongeur.  A  Bovie extender was then used to expose the spinous process and lamina.  Careful attention was placed to not violate the facet capsule. A 3 mm matchstick drill bit was then used to make a hemi-laminotomy trough until the ligamentum flavum was exposed.  This was extended to the base of the spinous process and to the contralateral side to remove all the central bone from each side.  Once this was complete and the underlying ligamentum flavum was visualized, it was dissected with a curette and resected with Kerrison rongeurs.  Extensive ligamentum hypertrophy was noted, requiring a substantial amount of time and care for removal.  The dura was identified and palpated. The kerrison rongeur was then used to remove the medial facet bilaterally until no compression was noted.  A balltip probe was used to confirm decompression of the ipsilateral L5 nerve root.  Additional attention was paid to completion of the contralateral foraminotomy until the contralateral L5 nerve root was completely free.  Once this was complete, L4-5 central decompression including medial facetectomy and foraminotomy was confirmed and decompression on both sides was confirmed. A small amount of clear fluid was coming from a small bleb on the dorsal dura at the leading edge of L5.  Duragen and tisseel were used to augment this area of the dura.   A Depo-Medrol soaked Gelfoam pledget was placed in the defect.  The wound was copiously irrigated. The tube system was then removed under microscopic visualization and hemostasis was obtained with a bipolar.     The fascial layer was reapproximated with the use of a 0 Vicryl suture.  Subcutaneous tissue layer was reapproximated using 2-0 Vicryl suture.  3-0 nylon was placed on the skin.  The skin was then cleansed and dressed.   Patient was then rotated back to the preoperative bed awakened from anesthesia and taken to recovery all counts are correct in this case.  I performed the entire procedure  with the assistance of Cooper Render PA as an Pensions consultant.  Rustyn Conery K. Izora Ribas MD

## 2021-04-14 NOTE — Anesthesia Preprocedure Evaluation (Addendum)
Anesthesia Evaluation  Patient identified by MRN, date of birth, ID band Patient awake    Reviewed: Allergy & Precautions, NPO status , Patient's Chart, lab work & pertinent test results  History of Anesthesia Complications Negative for: history of anesthetic complications  Airway Mallampati: IV   Neck ROM: Full    Dental no notable dental hx.    Pulmonary neg pulmonary ROS,    Pulmonary exam normal breath sounds clear to auscultation       Cardiovascular hypertension, Normal cardiovascular exam Rhythm:Regular Rate:Normal  ECG 04/07/21:  Normal sinus rhythm Normal ECG   Neuro/Psych Spinal stenosis    GI/Hepatic negative GI ROS,   Endo/Other  diabetes, Type 2  Renal/GU negative Renal ROS     Musculoskeletal   Abdominal   Peds  Hematology negative hematology ROS (+)   Anesthesia Other Findings   Reproductive/Obstetrics                            Anesthesia Physical Anesthesia Plan  ASA: 2  Anesthesia Plan: General   Post-op Pain Management:    Induction: Intravenous  PONV Risk Score and Plan: 2 and Ondansetron, Dexamethasone and Treatment may vary due to age or medical condition  Airway Management Planned: Oral ETT  Additional Equipment:   Intra-op Plan:   Post-operative Plan: Extubation in OR  Informed Consent: I have reviewed the patients History and Physical, chart, labs and discussed the procedure including the risks, benefits and alternatives for the proposed anesthesia with the patient or authorized representative who has indicated his/her understanding and acceptance.     Dental advisory given  Plan Discussed with: CRNA  Anesthesia Plan Comments: (Patient consented for risks of anesthesia including but not limited to:  - adverse reactions to medications - damage to eyes, teeth, lips or other oral mucosa - nerve damage due to positioning  - sore throat or  hoarseness - damage to heart, brain, nerves, lungs, other parts of body or loss of life  Patient voiced understanding.)        Anesthesia Quick Evaluation

## 2021-04-15 ENCOUNTER — Encounter: Payer: Self-pay | Admitting: Neurosurgery

## 2022-05-23 ENCOUNTER — Ambulatory Visit
Admission: RE | Admit: 2022-05-23 | Discharge: 2022-05-23 | Disposition: A | Discharge status: Home / Self Care | Payer: Self-pay | Source: Ambulatory Visit | Attending: Orthopedic Surgery | Admitting: Orthopedic Surgery

## 2022-05-23 ENCOUNTER — Other Ambulatory Visit: Payer: Self-pay

## 2022-05-23 DIAGNOSIS — Z049 Encounter for examination and observation for unspecified reason: Secondary | ICD-10-CM

## 2022-05-24 NOTE — Progress Notes (Unsigned)
Referring Physician:  Mortimer Fries, PA-C [address] [facility] [address] [place],  [place] [address]  Primary Physician:  Dion Body, MD  History of Present Illness: 05/25/2022 Mr. Jatinder Mcdonagh has a history of DM, HTN, glaucoma, skin CA.   History of L4-S1 decompression by Dr. Izora Ribas on 04/14/21. He was doing great at his last visit with Danielle on 07/15/21.   He has new complaint of intermittent left leg pain that starts at his hip and is mostly in left thigh x 2 months. Pain is worse when he gets up/sits down. Also has pain with walking and getting in/out of car. He has pain in left anterior thigh, occasional pain to foot. He has some groin pain. No numbness or tingling in legs. No LBP.   He starts PT at [facility] on 06/16/22. He was started on mobic with some relief.   He has known OA of left hip and has been seeing ortho. They ordered PT. He had hip injection previously that only helped a few days. He has history of right THA.    Conservative measures:  Physical therapy: scheduled to start 06/16/22 for his left hip  Multimodal medical therapy including regular antiinflammatories: prednisone, mobic, ultram  Injections: No recent epidural steroid injections  Past Surgery: L4-S1 decompression by Dr. Izora Ribas on 04/14/21  Review of Systems:  A 10 point review of systems is negative, except for the pertinent positives and negatives detailed in the HPI.  Past Medical History: Past Medical History:  Diagnosis Date   Basal cell carcinoma    Cellulitis    Chronic pain of left knee    Diabetes mellitus without complication (HCC)    Hypertension    Infected sebaceous cyst    Mixed hyperlipidemia    Myalgia    Spinal stenosis of lumbar region with neurogenic claudication     Past Surgical History: Past Surgical History:  Procedure Laterality Date   COLONOSCOPY     HERNIA REPAIR Right    inguinal x 2   LUMBAR LAMINECTOMY/DECOMPRESSION MICRODISCECTOMY N/A  04/14/2021   Procedure: L4-S1 DECOMPRESSION;  Surgeon: Meade Maw, MD;  Location: [facility] ORS;  Service: Neurosurgery;  Laterality: N/A;   PILONIDAL CYST EXCISION     TONSILLECTOMY     TOTAL HIP ARTHROPLASTY Right    2010    Allergies: Allergies as of 05/25/2022 - Review Complete 05/25/2022  Allergen Reaction Noted   Statins  04/11/2016    Medications: Outpatient Encounter Medications as of 05/25/2022  Medication Sig   ezetimibe (ZETIA) 10 MG tablet Take 10 mg by mouth daily.   glimepiride (AMARYL) 2 MG tablet Take 2 mg by mouth every morning.   hydrochlorothiazide (MICROZIDE) 12.5 MG capsule Take 12.5 mg by mouth daily.   losartan (COZAAR) 100 MG tablet Take 100 mg by mouth daily.   OVER THE COUNTER MEDICATION Take 1 tablet by mouth every evening. bio-fisetin otc supplement   Red Yeast Rice 600 MG CAPS Take 1,200 mg by mouth 4 (four) times a week.   timolol (BETIMOL) 0.5 % ophthalmic solution Place 1 drop into both eyes daily.   [DISCONTINUED] methocarbamol (ROBAXIN) 500 MG tablet Take 1 tablet (500 mg total) by mouth 3 (three) times daily. (Patient not taking: Reported on 05/25/2022)   [DISCONTINUED] senna (SENOKOT) 8.6 MG TABS tablet Take 1 tablet (8.6 mg total) by mouth daily as needed for mild constipation. (Patient not taking: Reported on 05/25/2022)   No facility-administered encounter medications on file as of 05/25/2022.  Social History: Social History   Tobacco Use   Smoking status: Never   Smokeless tobacco: Never  Vaping Use   Vaping Use: Never used  Substance Use Topics   Alcohol use: Never   Drug use: Never    Family Medical History: History reviewed. No pertinent family history.  Physical Examination: Vitals:   05/25/22 0857  BP: 134/80    General: Patient is well developed, well nourished, calm, collected, and in no apparent distress. Attention to examination is appropriate.  Respiratory: Patient is breathing without any  difficulty.   NEUROLOGICAL:     Awake, alert, oriented to person, place, and time.  Speech is clear and fluent. Fund of knowledge is appropriate.   Cranial Nerves: Pupils equal round and reactive to light.  Facial tone is symmetric.    No abnormal lesions on exposed skin.   Well healed lumbar incision. No lumbar tenderness noted.   Strength: Side Biceps Triceps Deltoid Interossei Grip Wrist Ext. Wrist Flex.  R '5 5 5 5 5 5 5  '$ L '5 5 5 5 5 5 5   '$ Side Iliopsoas Quads Hamstring PF DF EHL  R '5 5 5 5 5 5  '$ L '5 5 5 5 5 5   '$ Reflexes are 2+ and symmetric at the biceps, triceps, brachioradialis, patella and achilles.   Hoffman's is absent.  Clonus is not present.   Bilateral upper and lower extremity sensation is intact to light touch.     He has limited painful IR/ER of left hip. ROM of left hip recreates his left thigh pain.   He has a limp favoring his left leg.    Medical Decision Making  Imaging: Lumbar xrays dated 04/22/22:  Retrolisthesis L3-L4, moderate DDD L5-S2, and diffuse lumbar spondylosis with anterior spurring.   Xray report not available for my review.   Assessment and Plan: Mr. Hallinan is a pleasant 76 y.o. male with intermittent left leg pain that starts at his hip and is mostly in left thigh x 2 months. Pain is worse when he gets up/sits down. Also has pain with walking and getting in/out of car. He has pain in left anterior thigh, occasional pain to foot. He has some groin pain.   History of  L4-S1 decompression by Dr. Izora Ribas on 04/14/21. He has known lumbar spondylosis and DDD. No current back or radicular leg pain.   He has limited painful IR/ER of left hip and this recreates his thigh pain.   Treatment options discussed with patient and following plan made:   - Discussed with patient that his left groin/anterior thigh pain appears to be hip mediated.  - Recommend he proceed with PT as ordered by ortho.  - Okay to continue with mobic as prescribed by ortho.  Take with food. Reviewed dosing and side effects. No ibuprofen when taking this medication. Can add prn OTC tylenol.  - Message sent to ortho Mortimer Fries PA-C) to contact patient regarding follow up.  - He will follow up here prn. Happy to see him if symptoms change.   I spent a total of 20 minutes in face-to-face and non-face-to-face activities related to this patient's care toda including review of outside records, review of imaging, review of symptoms, physical exam, discussion of differential diagnosis, discussion of treatment options, and documentation.   Geronimo Boot PA-C Dept. of Neurosurgery

## 2022-05-25 ENCOUNTER — Encounter: Payer: Self-pay | Admitting: Orthopedic Surgery

## 2022-05-25 ENCOUNTER — Ambulatory Visit (INDEPENDENT_AMBULATORY_CARE_PROVIDER_SITE_OTHER): Payer: Medicare Other | Admitting: Orthopedic Surgery

## 2022-05-25 VITALS — BP 134/80 | Ht 65.0 in | Wt 165.6 lb

## 2022-05-25 DIAGNOSIS — M47816 Spondylosis without myelopathy or radiculopathy, lumbar region: Secondary | ICD-10-CM

## 2022-05-25 DIAGNOSIS — M5136 Other intervertebral disc degeneration, lumbar region: Secondary | ICD-10-CM

## 2022-05-25 DIAGNOSIS — M25552 Pain in left hip: Secondary | ICD-10-CM

## 2022-05-25 NOTE — Patient Instructions (Signed)
It was so nice to see you today, I am sorry that you are hurting so much.   You have some wear and tear (arthritis) in your back, but I think this left leg pain is coming from you hip.   Continue on the meloxicam that you were given. Take as directed with food. Do not take ibuprofen with this medication. You can add a tylenol if needed.   Start physical therapy as scheduled.   Use your cane as needed. You should use it in your RIGHT hand.   I sent a message to Mortimer Fries to let him know what we talked about today. His staff should be calling you about a follow up.   I will see you back in as needed. Please do not hesitate to call if you have any questions or concerns. You can also message me in [place].   Geronimo Boot PA-C [phone]

## 2022-07-25 ENCOUNTER — Other Ambulatory Visit: Payer: Self-pay | Admitting: Orthopedic Surgery

## 2022-07-28 ENCOUNTER — Other Ambulatory Visit: Payer: Self-pay

## 2022-07-28 ENCOUNTER — Encounter
Admission: RE | Admit: 2022-07-28 | Discharge: 2022-07-28 | Disposition: A | Discharge status: Home / Self Care | Payer: Medicare Other | Source: Ambulatory Visit | Attending: Orthopedic Surgery | Admitting: Orthopedic Surgery

## 2022-07-28 VITALS — BP 147/81 | HR 67 | Resp 16 | Ht 65.0 in

## 2022-07-28 DIAGNOSIS — Z01812 Encounter for preprocedural laboratory examination: Secondary | ICD-10-CM | POA: Insufficient documentation

## 2022-07-28 HISTORY — DX: Unspecified asthma, uncomplicated: J45.909

## 2022-07-28 HISTORY — DX: Unspecified osteoarthritis, unspecified site: M19.90

## 2022-07-28 LAB — TYPE AND SCREEN
ABO/RH(D): O POS
Antibody Screen: NEGATIVE

## 2022-07-28 LAB — URINALYSIS, ROUTINE W REFLEX MICROSCOPIC
Bilirubin Urine: NEGATIVE
Glucose, UA: NEGATIVE mg/dL
Hgb urine dipstick: NEGATIVE
Ketones, ur: NEGATIVE mg/dL
Leukocytes,Ua: NEGATIVE
Nitrite: NEGATIVE
Protein, ur: NEGATIVE mg/dL
Specific Gravity, Urine: 1.03 (ref 1.005–1.030)
pH: 5 (ref 5.0–8.0)

## 2022-07-28 LAB — SURGICAL PCR SCREEN
MRSA, PCR: NEGATIVE
Staphylococcus aureus: NEGATIVE

## 2022-07-28 NOTE — Patient Instructions (Addendum)
Your procedure is scheduled on: 08/08/22 - Monday Report to the Registration Desk on the 1st floor of the [facility]. To find out your arrival time, please call [phone] between 1PM - 3PM on: 08/05/22 - Friday If your arrival time is 6:00 am, do not arrive before that time as the [facility] entrance doors do not open until 6:00 am.  REMEMBER: Instructions that are not followed completely may result in serious medical risk, up to and including death; or upon the discretion of your surgeon and anesthesiologist your surgery may need to be rescheduled.  Do not eat food after midnight the night before surgery.  No gum chewing or hard candies.  You may however, drink CLEAR liquids up to 2 hours before you are scheduled to arrive for your surgery. Do not drink anything within 2 hours of your scheduled arrival time.  Clear liquids include: - water   In addition, your doctor has ordered for you to drink the provided:  Gatorade G2 Drinking this carbohydrate drink up to two hours before surgery helps to reduce insulin resistance and improve patient outcomes. Please complete drinking 2 hours before scheduled arrival time.  One week prior to surgery: Beginning 07/31/22, Stop meloxicam (MOBIC) and Anti-inflammatories (NSAIDS) such as Advil, Aleve, Ibuprofen, Motrin, Naproxen, Naprosyn and Aspirin based products such as Excedrin, Goody's Powder, BC Powder.  Stop beginning 07/31/22, ANY OVER THE COUNTER supplements until after surgery, Red Yeast Rice , bio-fisetin .  You may however, continue to take Tylenol if needed for pain up until the day of surgery.   TAKE ONLY THESE MEDICATIONS THE MORNING OF SURGERY WITH A SIP OF WATER: - ezetimibe (ZETIA)     No Alcohol for 24 hours before or after surgery.  No Smoking including e-cigarettes for 24 hours before surgery.  No chewable tobacco products for at least 6 hours before surgery.  No nicotine patches on the day of surgery.  Do not use  any "recreational" drugs for at least a week (preferably 2 weeks) before your surgery.  Please be advised that the combination of cocaine and anesthesia may have negative outcomes, up to and including death. If you test positive for cocaine, your surgery will be cancelled.  On the morning of surgery brush your teeth with toothpaste and water, you may rinse your mouth with mouthwash if you wish. Do not swallow any toothpaste or mouthwash.  Use CHG Soap or wipes as directed on instruction sheet.  Do not wear jewelry, make-up, hairpins, clips or nail polish.  Do not wear lotions, powders, or perfumes.   Do not shave body hair from the neck down 48 hours before surgery.  Contact lenses, hearing aids and dentures may not be worn into surgery.  Do not bring valuables to the hospital. [facility] is not responsible for any missing/lost belongings or valuables.   Notify your doctor if there is any change in your medical condition (cold, fever, infection).  Wear comfortable clothing (specific to your surgery type) to the hospital.  After surgery, you can help prevent lung complications by doing breathing exercises.  Take deep breaths and cough every 1-2 hours. Your doctor may order a device called an Incentive Spirometer to help you take deep breaths. When coughing or sneezing, hold a pillow firmly against your incision with both hands. This is called "splinting." Doing this helps protect your incision. It also decreases belly discomfort.  If you are being admitted to the hospital overnight, leave your suitcase in the car.  After surgery it may be brought to your room.  In case of increased patient census, it may be necessary for you, the patient, to continue your postoperative care in the Same Day Surgery department.  If you are being discharged the day of surgery, you will not be allowed to drive home. You will need a responsible individual to drive you home and stay with you for 24 hours  after surgery.   If you are taking public transportation, you will need to have a responsible individual with you.  Please call the [place] Dept. at [phone] if you have any questions about these instructions.  Surgery Visitation Policy:  Patients undergoing a surgery or procedure may have two family members or support persons with them as long as the person is not COVID-19 positive or experiencing its symptoms.   Inpatient Visitation:    Visiting hours are 7 a.m. to 8 p.m. Up to four visitors are allowed at one time in a patient room. The visitors may rotate out with other people during the day. One designated support person (adult) may remain overnight.  Due to an increase in RSV and influenza rates and associated hospitalizations, children ages 76 and under will not be able to visit patients in [facility].  Masks continue to be strongly recommended.    Preparing for Surgery with CHLORHEXIDINE GLUCONATE (CHG) Soap  Chlorhexidine Gluconate (CHG) Soap  o An antiseptic cleaner that kills germs and bonds with the skin to continue killing germs even after washing  o Used for showering the night before surgery and morning of surgery  Before surgery, you can play an important role by reducing the number of germs on your skin.  CHG (Chlorhexidine gluconate) soap is an antiseptic cleanser which kills germs and bonds with the skin to continue killing germs even after washing.  Please do not use if you have an allergy to CHG or antibacterial soaps. If your skin becomes reddened/irritated stop using the CHG.  1. Shower the NIGHT BEFORE SURGERY and the MORNING OF SURGERY with CHG soap.  2. If you choose to wash your hair, wash your hair first as usual with your normal shampoo.  3. After shampooing, rinse your hair and body thoroughly to remove the shampoo.  4. Use CHG as you would any other liquid soap. You can apply CHG directly to the skin and wash  gently with a scrungie or a clean washcloth.  5. Apply the CHG soap to your body only from the neck down. Do not use on open wounds or open sores. Avoid contact with your eyes, ears, mouth, and genitals (private parts). Wash face and genitals (private parts) with your normal soap.  6. Wash thoroughly, paying special attention to the area where your surgery will be performed.  7. Thoroughly rinse your body with warm water.  8. Do not shower/wash with your normal soap after using and rinsing off the CHG soap.  9. Pat yourself dry with a clean towel.  10. Wear clean pajamas to bed the night before surgery.  12. Place clean sheets on your bed the night of your first shower and do not sleep with pets.  13. Shower again with the CHG soap on the day of surgery prior to arriving at the hospital.  14. Do not apply any deodorants/lotions/powders.  15. Please wear clean clothes to the hospital.  How to Use an Incentive Spirometer  An incentive spirometer is a tool that measures how well you are  filling your lungs with each breath. Learning to take long, deep breaths using this tool can help you keep your lungs clear and active. This may help to reverse or lessen your chance of developing breathing (pulmonary) problems, especially infection. You may be asked to use a spirometer: After a surgery. If you have a lung problem or a history of smoking. After a long period of time when you have been unable to move or be active. If the spirometer includes an indicator to show the highest number that you have reached, your health care provider or respiratory therapist will help you set a goal. Keep a log of your progress as told by your health care provider. What are the risks? Breathing too quickly may cause dizziness or cause you to pass out. Take your time so you do not get dizzy or light-headed. If you are in pain, you may need to take pain medicine before doing incentive spirometry. It is harder to  take a deep breath if you are having pain. How to use your incentive spirometer  Sit up on the edge of your bed or on a chair. Hold the incentive spirometer so that it is in an upright position. Before you use the spirometer, breathe out normally. Place the mouthpiece in your mouth. Make sure your lips are closed tightly around it. Breathe in slowly and as deeply as you can through your mouth, causing the piston or the ball to rise toward the top of the chamber. Hold your breath for 3-5 seconds, or for as long as possible. If the spirometer includes a coach indicator, use this to guide you in breathing. Slow down your breathing if the indicator goes above the marked areas. Remove the mouthpiece from your mouth and breathe out normally. The piston or ball will return to the bottom of the chamber. Rest for a few seconds, then repeat the steps 10 or more times. Take your time and take a few normal breaths between deep breaths so that you do not get dizzy or light-headed. Do this every 1-2 hours when you are awake. If the spirometer includes a goal marker to show the highest number you have reached (best effort), use this as a goal to work toward during each repetition. After each set of 10 deep breaths, cough a few times. This will help to make sure that your lungs are clear. If you have an incision on your chest or abdomen from surgery, place a pillow or a rolled-up towel firmly against the incision when you cough. This can help to reduce pain while taking deep breaths and coughing. General tips When you are able to get out of bed: Walk around often. Continue to take deep breaths and cough in order to clear your lungs. Keep using the incentive spirometer until your health care provider says it is okay to stop using it. If you have been in the hospital, you may be told to keep using the spirometer at home. Contact a health care provider if: You are having difficulty using the spirometer. You  have trouble using the spirometer as often as instructed. Your pain medicine is not giving enough relief for you to use the spirometer as told. You have a fever. Get help right away if: You develop shortness of breath. You develop a cough with bloody mucus from the lungs. You have fluid or blood coming from an incision site after you cough. Summary An incentive spirometer is a tool that can help you learn to  take long, deep breaths to keep your lungs clear and active. You may be asked to use a spirometer after a surgery, if you have a lung problem or a history of smoking, or if you have been inactive for a long period of time. Use your incentive spirometer as instructed every 1-2 hours while you are awake. If you have an incision on your chest or abdomen, place a pillow or a rolled-up towel firmly against your incision when you cough. This will help to reduce pain. Get help right away if you have shortness of breath, you cough up bloody mucus, or blood comes from your incision when you cough. This information is not intended to replace advice given to you by your health care provider. Make sure you discuss any questions you have with your health care provider. Document Revised: 08/19/2019 Document Reviewed: 08/19/2019 Elsevier Patient Education  [place].

## 2022-08-08 ENCOUNTER — Ambulatory Visit: Payer: Medicare Other | Admitting: Family

## 2022-08-08 ENCOUNTER — Ambulatory Visit: Payer: Medicare Other

## 2022-08-08 ENCOUNTER — Ambulatory Visit: Payer: Medicare Other | Admitting: Urgent Care

## 2022-08-08 ENCOUNTER — Other Ambulatory Visit: Payer: Self-pay

## 2022-08-08 ENCOUNTER — Encounter
Admission: RE | Disposition: A | Discharge status: Home Health | Payer: Self-pay | Source: Ambulatory Visit | Attending: Orthopedic Surgery

## 2022-08-08 ENCOUNTER — Observation Stay
Admission: RE | Admit: 2022-08-08 | Discharge: 2022-08-09 | Disposition: A | Discharge status: Home Health | Payer: Medicare Other | Source: Ambulatory Visit | Attending: Orthopedic Surgery | Admitting: Orthopedic Surgery

## 2022-08-08 ENCOUNTER — Encounter: Payer: Self-pay | Admitting: Orthopedic Surgery

## 2022-08-08 DIAGNOSIS — M1612 Unilateral primary osteoarthritis, left hip: Principal | ICD-10-CM | POA: Insufficient documentation

## 2022-08-08 DIAGNOSIS — E119 Type 2 diabetes mellitus without complications: Secondary | ICD-10-CM | POA: Insufficient documentation

## 2022-08-08 DIAGNOSIS — J45909 Unspecified asthma, uncomplicated: Secondary | ICD-10-CM | POA: Diagnosis not present

## 2022-08-08 DIAGNOSIS — Z79899 Other long term (current) drug therapy: Secondary | ICD-10-CM | POA: Diagnosis not present

## 2022-08-08 DIAGNOSIS — Z01812 Encounter for preprocedural laboratory examination: Secondary | ICD-10-CM

## 2022-08-08 DIAGNOSIS — I10 Essential (primary) hypertension: Secondary | ICD-10-CM | POA: Diagnosis not present

## 2022-08-08 DIAGNOSIS — Z85828 Personal history of other malignant neoplasm of skin: Secondary | ICD-10-CM | POA: Diagnosis not present

## 2022-08-08 HISTORY — PX: TOTAL HIP ARTHROPLASTY: SHX124

## 2022-08-08 LAB — GLUCOSE, CAPILLARY
Glucose-Capillary: 102 mg/dL — ABNORMAL HIGH (ref 70–99)
Glucose-Capillary: 191 mg/dL — ABNORMAL HIGH (ref 70–99)
Glucose-Capillary: 222 mg/dL — ABNORMAL HIGH (ref 70–99)
Glucose-Capillary: 214 mg/dL — ABNORMAL HIGH (ref 70–99)

## 2022-08-08 SURGERY — ARTHROPLASTY, HIP, TOTAL, ANTERIOR APPROACH
Anesthesia: General | Site: Hip | Laterality: Left

## 2022-08-08 MED ORDER — CEFAZOLIN SODIUM-DEXTROSE 2-4 GM/100ML-% IV SOLN
2.0000 g | INTRAVENOUS | Status: AC
  Administered 2022-08-08: 2 g via INTRAVENOUS

## 2022-08-08 MED ORDER — BUPIVACAINE LIPOSOME 1.3 % IJ SUSP
INTRAMUSCULAR | Status: AC
  Filled 2022-08-08: qty 20

## 2022-08-08 MED ORDER — PROPOFOL 10 MG/ML IV BOLUS
INTRAVENOUS | Status: DC | PRN
  Administered 2022-08-08: 110 mg via INTRAVENOUS

## 2022-08-08 MED ORDER — HYDROCODONE-ACETAMINOPHEN 5-325 MG PO TABS: 1.0000 | ORAL_TABLET | ORAL | Status: DC | PRN

## 2022-08-08 MED ORDER — LOSARTAN POTASSIUM 50 MG PO TABS
100.0000 mg | ORAL_TABLET | Freq: Every day | ORAL | Status: DC
  Administered 2022-08-08: 100 mg via ORAL
  Filled 2022-08-08 (×2): qty 2

## 2022-08-08 MED ORDER — HYDROMORPHONE HCL 1 MG/ML IJ SOLN
INTRAMUSCULAR | Status: AC
  Administered 2022-08-08: 0.5 mg via INTRAVENOUS
  Filled 2022-08-08: qty 1

## 2022-08-08 MED ORDER — FENTANYL CITRATE (PF) 100 MCG/2ML IJ SOLN
INTRAMUSCULAR | Status: DC | PRN
  Administered 2022-08-08 (×4): 25 ug via INTRAVENOUS

## 2022-08-08 MED ORDER — INSULIN ASPART 100 UNIT/ML IJ SOLN
0.0000 [IU] | Freq: Every day | INTRAMUSCULAR | Status: DC
  Administered 2022-08-08: 2 [IU] via SUBCUTANEOUS
  Filled 2022-08-08: qty 1

## 2022-08-08 MED ORDER — STERILE WATER FOR IRRIGATION IR SOLN
Status: DC | PRN
  Administered 2022-08-08: 1000 mL

## 2022-08-08 MED ORDER — ACETAMINOPHEN 10 MG/ML IV SOLN
1000.0000 mg | Freq: Once | INTRAVENOUS | Status: AC
  Administered 2022-08-08: 1000 mg via INTRAVENOUS

## 2022-08-08 MED ORDER — FENTANYL CITRATE (PF) 100 MCG/2ML IJ SOLN
INTRAMUSCULAR | Status: AC
  Filled 2022-08-08: qty 2

## 2022-08-08 MED ORDER — SODIUM CHLORIDE 0.9 % IV SOLN: INTRAVENOUS | Status: DC

## 2022-08-08 MED ORDER — TRANEXAMIC ACID-NACL 1000-0.7 MG/100ML-% IV SOLN
INTRAVENOUS | Status: AC
  Filled 2022-08-08: qty 100

## 2022-08-08 MED ORDER — ACETAMINOPHEN 500 MG PO TABS
1000.0000 mg | ORAL_TABLET | Freq: Three times a day (TID) | ORAL | Status: DC
  Administered 2022-08-08 – 2022-08-09 (×3): 1000 mg via ORAL
  Filled 2022-08-08 (×3): qty 2

## 2022-08-08 MED ORDER — FENTANYL CITRATE (PF) 100 MCG/2ML IJ SOLN
25.0000 ug | INTRAMUSCULAR | Status: DC | PRN
  Administered 2022-08-08 (×3): 25 ug via INTRAVENOUS

## 2022-08-08 MED ORDER — MIDAZOLAM HCL 2 MG/2ML IJ SOLN
INTRAMUSCULAR | Status: AC
  Filled 2022-08-08: qty 2

## 2022-08-08 MED ORDER — CEFAZOLIN SODIUM-DEXTROSE 2-4 GM/100ML-% IV SOLN
INTRAVENOUS | Status: AC
  Administered 2022-08-08: 2 g via INTRAVENOUS
  Filled 2022-08-08: qty 100

## 2022-08-08 MED ORDER — CHLORHEXIDINE GLUCONATE 0.12 % MT SOLN
OROMUCOSAL | Status: AC
  Administered 2022-08-08: 15 mL via OROMUCOSAL
  Filled 2022-08-08: qty 15

## 2022-08-08 MED ORDER — ONDANSETRON HCL 4 MG/2ML IJ SOLN
INTRAMUSCULAR | Status: AC
  Filled 2022-08-08: qty 2

## 2022-08-08 MED ORDER — ONDANSETRON HCL 4 MG/2ML IJ SOLN: 4.0000 mg | Freq: Four times a day (QID) | INTRAMUSCULAR | Status: DC | PRN

## 2022-08-08 MED ORDER — FAMOTIDINE 20 MG PO TABS: 20.0000 mg | ORAL_TABLET | Freq: Once | ORAL | Status: AC

## 2022-08-08 MED ORDER — SODIUM CHLORIDE 0.9 % IV SOLN
INTRAVENOUS | Status: DC | PRN
  Administered 2022-08-08: 40 mL

## 2022-08-08 MED ORDER — ROCURONIUM BROMIDE 10 MG/ML (PF) SYRINGE
PREFILLED_SYRINGE | INTRAVENOUS | Status: AC
  Filled 2022-08-08: qty 10

## 2022-08-08 MED ORDER — EZETIMIBE 10 MG PO TABS
10.0000 mg | ORAL_TABLET | Freq: Every day | ORAL | Status: DC
  Administered 2022-08-09: 10 mg via ORAL
  Filled 2022-08-08: qty 1

## 2022-08-08 MED ORDER — MORPHINE SULFATE (PF) 2 MG/ML IV SOLN: 0.5000 mg | INTRAVENOUS | Status: DC | PRN

## 2022-08-08 MED ORDER — FAMOTIDINE 20 MG PO TABS
ORAL_TABLET | ORAL | Status: AC
  Administered 2022-08-08: 20 mg via ORAL
  Filled 2022-08-08: qty 1

## 2022-08-08 MED ORDER — SUGAMMADEX SODIUM 200 MG/2ML IV SOLN
INTRAVENOUS | Status: DC | PRN
  Administered 2022-08-08: 100 mg via INTRAVENOUS

## 2022-08-08 MED ORDER — ORAL CARE MOUTH RINSE: 15.0000 mL | Freq: Once | OROMUCOSAL | Status: AC

## 2022-08-08 MED ORDER — ENOXAPARIN SODIUM 40 MG/0.4ML IJ SOSY
40.0000 mg | PREFILLED_SYRINGE | INTRAMUSCULAR | Status: DC
  Administered 2022-08-09: 40 mg via SUBCUTANEOUS
  Filled 2022-08-08: qty 0.4

## 2022-08-08 MED ORDER — TIMOLOL MALEATE 0.5 % OP SOLN
1.0000 [drp] | Freq: Every day | OPHTHALMIC | Status: DC
  Administered 2022-08-09: 1 [drp] via OPHTHALMIC
  Filled 2022-08-08: qty 5

## 2022-08-08 MED ORDER — ONDANSETRON HCL 4 MG PO TABS: 4.0000 mg | ORAL_TABLET | Freq: Four times a day (QID) | ORAL | Status: DC | PRN

## 2022-08-08 MED ORDER — EPHEDRINE 5 MG/ML INJ
INTRAVENOUS | Status: AC
  Filled 2022-08-08: qty 5

## 2022-08-08 MED ORDER — OXYCODONE HCL 5 MG PO TABS: 5.0000 mg | ORAL_TABLET | Freq: Once | ORAL | Status: DC | PRN

## 2022-08-08 MED ORDER — DEXAMETHASONE SODIUM PHOSPHATE 10 MG/ML IJ SOLN
INTRAMUSCULAR | Status: AC
  Filled 2022-08-08: qty 1

## 2022-08-08 MED ORDER — TRANEXAMIC ACID-NACL 1000-0.7 MG/100ML-% IV SOLN
1000.0000 mg | INTRAVENOUS | Status: AC
  Administered 2022-08-08 (×2): 1000 mg via INTRAVENOUS

## 2022-08-08 MED ORDER — ACETAMINOPHEN 10 MG/ML IV SOLN
INTRAVENOUS | Status: AC
  Filled 2022-08-08: qty 100

## 2022-08-08 MED ORDER — METOCLOPRAMIDE HCL 5 MG PO TABS: 5.0000 mg | ORAL_TABLET | Freq: Three times a day (TID) | ORAL | Status: DC | PRN

## 2022-08-08 MED ORDER — SODIUM CHLORIDE FLUSH 0.9 % IV SOLN
INTRAVENOUS | Status: AC
  Filled 2022-08-08: qty 30

## 2022-08-08 MED ORDER — CHLORHEXIDINE GLUCONATE 0.12 % MT SOLN: 15.0000 mL | Freq: Once | OROMUCOSAL | Status: AC

## 2022-08-08 MED ORDER — METOCLOPRAMIDE HCL 5 MG/ML IJ SOLN: 5.0000 mg | Freq: Three times a day (TID) | INTRAMUSCULAR | Status: DC | PRN

## 2022-08-08 MED ORDER — INSULIN ASPART 100 UNIT/ML IJ SOLN
0.0000 [IU] | Freq: Three times a day (TID) | INTRAMUSCULAR | Status: DC
  Administered 2022-08-08: 5 [IU] via SUBCUTANEOUS
  Administered 2022-08-09: 3 [IU] via SUBCUTANEOUS
  Administered 2022-08-09: 2 [IU] via SUBCUTANEOUS
  Filled 2022-08-08 (×3): qty 1

## 2022-08-08 MED ORDER — DEXAMETHASONE SODIUM PHOSPHATE 10 MG/ML IJ SOLN
8.0000 mg | Freq: Once | INTRAMUSCULAR | Status: AC
  Administered 2022-08-08: 8 mg via INTRAVENOUS

## 2022-08-08 MED ORDER — ROCURONIUM BROMIDE 100 MG/10ML IV SOLN
INTRAVENOUS | Status: DC | PRN
  Administered 2022-08-08: 50 mg via INTRAVENOUS

## 2022-08-08 MED ORDER — DEXMEDETOMIDINE HCL IN NACL 80 MCG/20ML IV SOLN
INTRAVENOUS | Status: DC | PRN
  Administered 2022-08-08: 4 ug via BUCCAL

## 2022-08-08 MED ORDER — PROPOFOL 1000 MG/100ML IV EMUL
INTRAVENOUS | Status: AC
  Filled 2022-08-08: qty 100

## 2022-08-08 MED ORDER — SURGIRINSE WOUND IRRIGATION SYSTEM - OPTIME
TOPICAL | Status: DC | PRN
  Administered 2022-08-08: 900 mL via TOPICAL

## 2022-08-08 MED ORDER — 0.9 % SODIUM CHLORIDE (POUR BTL) OPTIME
TOPICAL | Status: DC | PRN
  Administered 2022-08-08: 1000 mL

## 2022-08-08 MED ORDER — PHENOL 1.4 % MT LIQD: 1.0000 | OROMUCOSAL | Status: DC | PRN

## 2022-08-08 MED ORDER — CEFAZOLIN SODIUM-DEXTROSE 2-4 GM/100ML-% IV SOLN
2.0000 g | Freq: Four times a day (QID) | INTRAVENOUS | Status: AC
  Filled 2022-08-08: qty 100

## 2022-08-08 MED ORDER — ONDANSETRON HCL 4 MG/2ML IJ SOLN
4.0000 mg | Freq: Once | INTRAMUSCULAR | Status: AC
  Administered 2022-08-08: 4 mg via INTRAVENOUS

## 2022-08-08 MED ORDER — HYDROCHLOROTHIAZIDE 12.5 MG PO TABS
12.5000 mg | ORAL_TABLET | Freq: Every day | ORAL | Status: DC
  Administered 2022-08-08: 12.5 mg via ORAL
  Filled 2022-08-08 (×2): qty 1

## 2022-08-08 MED ORDER — MENTHOL 3 MG MT LOZG: 1.0000 | LOZENGE | OROMUCOSAL | Status: DC | PRN

## 2022-08-08 MED ORDER — KETOROLAC TROMETHAMINE 15 MG/ML IJ SOLN
INTRAMUSCULAR | Status: AC
  Filled 2022-08-08: qty 1

## 2022-08-08 MED ORDER — OXYCODONE HCL 5 MG/5ML PO SOLN: 5.0000 mg | Freq: Once | ORAL | Status: DC | PRN

## 2022-08-08 MED ORDER — PANTOPRAZOLE SODIUM 40 MG PO TBEC
40.0000 mg | DELAYED_RELEASE_TABLET | Freq: Every day | ORAL | Status: DC
  Administered 2022-08-08 – 2022-08-09 (×2): 40 mg via ORAL
  Filled 2022-08-08 (×2): qty 1

## 2022-08-08 MED ORDER — TRANEXAMIC ACID 1000 MG/10ML IV SOLN
INTRAVENOUS | Status: AC
  Filled 2022-08-08: qty 10

## 2022-08-08 MED ORDER — KETOROLAC TROMETHAMINE 15 MG/ML IJ SOLN
7.5000 mg | Freq: Four times a day (QID) | INTRAMUSCULAR | Status: AC
  Administered 2022-08-08 – 2022-08-09 (×4): 7.5 mg via INTRAVENOUS
  Filled 2022-08-08 (×3): qty 1

## 2022-08-08 MED ORDER — TRAMADOL HCL 50 MG PO TABS: 50.0000 mg | ORAL_TABLET | Freq: Four times a day (QID) | ORAL | Status: DC | PRN

## 2022-08-08 MED ORDER — BUPIVACAINE HCL (PF) 0.25 % IJ SOLN
INTRAMUSCULAR | Status: AC
  Filled 2022-08-08: qty 30

## 2022-08-08 MED ORDER — HYDROMORPHONE HCL 1 MG/ML IJ SOLN
0.5000 mg | INTRAMUSCULAR | Status: AC | PRN
  Administered 2022-08-08: 0.5 mg via INTRAVENOUS

## 2022-08-08 MED ORDER — FENTANYL CITRATE (PF) 100 MCG/2ML IJ SOLN
INTRAMUSCULAR | Status: AC
  Administered 2022-08-08: 25 ug via INTRAVENOUS
  Filled 2022-08-08: qty 2

## 2022-08-08 MED ORDER — DOCUSATE SODIUM 100 MG PO CAPS
100.0000 mg | ORAL_CAPSULE | Freq: Two times a day (BID) | ORAL | Status: DC
  Administered 2022-08-08 – 2022-08-09 (×3): 100 mg via ORAL
  Filled 2022-08-08 (×3): qty 1

## 2022-08-08 MED ORDER — EPINEPHRINE PF 1 MG/ML IJ SOLN
INTRAMUSCULAR | Status: AC
  Filled 2022-08-08: qty 1

## 2022-08-08 MED ORDER — BUPIVACAINE-EPINEPHRINE (PF) 0.25% -1:200000 IJ SOLN
INTRAMUSCULAR | Status: DC | PRN
  Administered 2022-08-08: 30 mL via PERINEURAL

## 2022-08-08 SURGICAL SUPPLY — 66 items
BLADE SAGITTAL AGGR TOOTH XLG (BLADE) ×1 IMPLANT
BNDG COHESIVE 6X5 TAN ST LF (GAUZE/BANDAGES/DRESSINGS) ×1 IMPLANT
CHLORAPREP W/TINT 26 (MISCELLANEOUS) ×1 IMPLANT
COVER BACK TABLE REUSABLE LG (DRAPES) ×1 IMPLANT
DERMABOND ADVANCED .7 DNX12 (GAUZE/BANDAGES/DRESSINGS) ×1 IMPLANT
DRAPE 3/4 80X56 (DRAPES) ×1 IMPLANT
DRAPE C-ARM XRAY 36X54 (DRAPES) ×1 IMPLANT
DRAPE POUCH INSTRU U-SHP 10X18 (DRAPES) ×1 IMPLANT
DRSG MEPILEX SACRM 8.7X9.8 (GAUZE/BANDAGES/DRESSINGS) ×1 IMPLANT
DRSG OPSITE POSTOP 4X8 (GAUZE/BANDAGES/DRESSINGS) ×1 IMPLANT
ELECT BLADE 4.0 EZ CLEAN MEGAD (MISCELLANEOUS) ×1
ELECT REM PT RETURN 9FT ADLT (ELECTROSURGICAL) ×1
ELECTRODE BLDE 4.0 EZ CLN MEGD (MISCELLANEOUS) ×1 IMPLANT
ELECTRODE REM PT RTRN 9FT ADLT (ELECTROSURGICAL) ×1 IMPLANT
GLOVE BIO SURGEON STRL SZ8 (GLOVE) ×1 IMPLANT
GLOVE BIOGEL PI IND STRL 8 (GLOVE) ×1 IMPLANT
GLOVE PI ORTHO PRO STRL 7.5 (GLOVE) ×2 IMPLANT
GLOVE PI ORTHO PRO STRL SZ8 (GLOVE) ×2 IMPLANT
GLOVE SURG SYN 7.5  E (GLOVE) ×1
GLOVE SURG SYN 7.5 E (GLOVE) ×1 IMPLANT
GLOVE SURG SYN 7.5 PF PI (GLOVE) ×1 IMPLANT
GOWN STRL REUS W/ TWL LRG LVL3 (GOWN DISPOSABLE) ×1 IMPLANT
GOWN STRL REUS W/ TWL XL LVL3 (GOWN DISPOSABLE) ×2 IMPLANT
GOWN STRL REUS W/TWL LRG LVL3 (GOWN DISPOSABLE) ×1
GOWN STRL REUS W/TWL XL LVL3 (GOWN DISPOSABLE) ×2
HANDLE YANKAUER SUCT OPEN TIP (MISCELLANEOUS) ×1 IMPLANT
HEAD FEM CER V40 28 -4 (Head) IMPLANT
HOLDER FOLEY CATH W/STRAP (MISCELLANEOUS) ×1 IMPLANT
HOOD PEEL AWAY T7 (MISCELLANEOUS) ×2 IMPLANT
IV NS 100ML SINGLE PACK (IV SOLUTION) ×1 IMPLANT
KIT PATIENT CARE HANA TABLE (KITS) ×1 IMPLANT
LIGHT WAVEGUIDE WIDE FLAT (MISCELLANEOUS) ×1 IMPLANT
LINER 42MM E (Orthopedic Implant) IMPLANT
LINER ADM MDM INS 28/48 42E (Liner) IMPLANT
MANIFOLD NEPTUNE II (INSTRUMENTS) ×1 IMPLANT
MARKER SKIN DUAL TIP RULER LAB (MISCELLANEOUS) ×1 IMPLANT
MAT ABSORB  FLUID 56X50 GRAY (MISCELLANEOUS) ×1
MAT ABSORB FLUID 56X50 GRAY (MISCELLANEOUS) ×1 IMPLANT
NDL SPNL 20GX3.5 QUINCKE YW (NEEDLE) ×1 IMPLANT
NEEDLE SPNL 20GX3.5 QUINCKE YW (NEEDLE) ×1 IMPLANT
NS IRRIG 500ML POUR BTL (IV SOLUTION) ×1 IMPLANT
PACK HIP COMPR (MISCELLANEOUS) ×1 IMPLANT
SCREW HEX LP 6.5X20 (Screw) IMPLANT
SCREW HEX LP 6.5X25 (Screw) IMPLANT
SHELL CLUSTERHOLE ACETABULAR 5 (Shell) IMPLANT
SLEEVE SCD COMPRESS KNEE MED (STOCKING) ×1 IMPLANT
SOLUTION IRRIG SURGIPHOR (IV SOLUTION) ×1 IMPLANT
STEM FEM HIGH OFFSET SZ2 36X99 (Stem) IMPLANT
SURGIFLO W/THROMBIN 8M KIT (HEMOSTASIS) IMPLANT
SUT BONE WAX W31G (SUTURE) ×1 IMPLANT
SUT DVC 2 QUILL PDO  T11 36X36 (SUTURE) ×1
SUT DVC 2 QUILL PDO T11 36X36 (SUTURE) ×1 IMPLANT
SUT ETHIBOND 2 V 37 (SUTURE) ×1 IMPLANT
SUT QUILL MONODERM 3-0 PS-2 (SUTURE) ×1 IMPLANT
SUT SILK 0 (SUTURE) ×1
SUT SILK 0 30XBRD TIE 6 (SUTURE) ×1 IMPLANT
SUT VIC AB 0 CT1 36 (SUTURE) ×1 IMPLANT
SUT VIC AB 2-0 CT2 27 (SUTURE) ×2 IMPLANT
SYR 10ML LL (SYRINGE) ×1 IMPLANT
SYR 30ML LL (SYRINGE) ×2 IMPLANT
TAPE MICROFOAM 4IN (TAPE) IMPLANT
TOWEL OR 17X26 4PK STRL BLUE (TOWEL DISPOSABLE) IMPLANT
TRAP FLUID SMOKE EVACUATOR (MISCELLANEOUS) ×1 IMPLANT
TRAY FOLEY SLVR 16FR LF STAT (SET/KITS/TRAYS/PACK) ×1 IMPLANT
WAND WEREWOLF FASTSEAL 6.0 (MISCELLANEOUS) ×1 IMPLANT
WATER STERILE IRR 1000ML POUR (IV SOLUTION) ×1 IMPLANT

## 2022-08-08 NOTE — Anesthesia Postprocedure Evaluation (Signed)
Anesthesia Post Note  Patient: Vincent Small  Procedure(s) Performed: TOTAL HIP ARTHROPLASTY ANTERIOR APPROACH (Left: Hip)  Patient location during evaluation: PACU Anesthesia Type: General Level of consciousness: awake and alert Pain management: pain level controlled Vital Signs Assessment: post-procedure vital signs reviewed and stable Respiratory status: spontaneous breathing, nonlabored ventilation, respiratory function stable and patient connected to nasal cannula oxygen Cardiovascular status: blood pressure returned to baseline and stable Postop Assessment: no apparent nausea or vomiting Anesthetic complications: no   No notable events documented.   Last Vitals:  Vitals:   08/08/22 1145 08/08/22 1200  BP: (!) 142/66 (!) 141/60  Pulse: 89 95  Resp: 17 19  Temp: 36.8 C   SpO2: 99% 99%    Last Pain:  Vitals:   08/08/22 1200  TempSrc:   PainSc: 5                  Precious Haws Caylee Vlachos

## 2022-08-08 NOTE — H&P (Signed)
History of Present Illness: Vincent Small is an 77 y.o. male presents for evaluation of his left hip. The patient reports he has had pain in his left hip for the last 6 months which worsened over the past 3 to 4 months and has intermittently affected him in the past. Patient describes left groin pain which radiates down his anterior left thigh and prevents him from walking or moving his leg to put on his socks and shoes. He reports he is now unable to get in and out of a car with severe pain and is unable to sleep due to his hip. He reports he is unable to fully straighten his leg due to pain in his groin. Patient has a history of lumbar surgery in 2022 and was evaluated by neurosurgery and found that this current pain is not coming from his back. The patient has undergone conservative treatment with meloxicam and physical therapy and had no improvement in his symptoms. He reports the pain and discomfort is a functional disability for him at this point despite conservative treatments. He reports his pain gets up to a 9 out of 10. He had a right hip replacement about 14 years ago and reports this feels very similar to his right hip prior to having the hip replacement and that he got an injection in that hip at that time prior to the hip replacement which provided him no relief. Given this experience with prior hip injections he has no interest in undergoing hip injection on the left side at this time. The patient denies fevers, chills, numbness, tingling, shortness of breath, chest pain, recent illness, or any trauma. Patient reports he had a injection into his buttock of Kenalog in November but not into the joint. The patient is a non-smoker, he is a borderline diabetic with his last hemoglobin A1c of 7.0, his BMI is 28.2  Medical records reviewed from internal medicine visits and outpatient records.  Past Medical History: Past Medical History:  Diagnosis Date  Diabetes mellitus type 2, uncomplicated  (CMS-HCC)  diet-controlled  Essential hypertension  Glaucoma (increased eye pressure)  Hip arthritis  History of skin cancer  Followed by Dr. Evorn Gong- [facility]  Pure hypercholesterolemia  Spinal stenosis   Past Surgical History: Past Surgical History:  Procedure Laterality Date  COLONOSCOPY 03/22/2013  repeat 10 years  L4-S1 DECOMPRESSION 04/14/2021  Dr. Meade Maw at [facility].  BIOPSY SKIN ARM  HERNIA REPAIR Right  x2  JOINT REPLACEMENT Right  hip by Dr. Mauri Pole, 2010  MOHS procedure of left ear  Skin cancer removal   Past Family History: Family History  Problem Relation Age of Onset  Stroke Mother  Heart failure Father  No Known Problems Brother   Medications: Current Outpatient Medications Ordered in Epic  Medication Sig Dispense Refill  ezetimibe (ZETIA) 10 mg tablet Take 1 tablet by mouth once daily 90 tablet 0  glimepiride (AMARYL) 2 MG tablet Take 1 tablet by mouth once daily with breakfast 90 tablet 1  hydroCHLOROthiazide (HYDRODIURIL) 12.5 MG tablet Take 1 tablet by mouth once daily 90 tablet 0  losartan (COZAAR) 100 MG tablet Take 1 tablet by mouth once daily 90 tablet 0  meloxicam (MOBIC) 15 MG tablet Take 1 tablet (15 mg total) by mouth once daily 30 tablet 11  methocarbamoL (ROBAXIN) 500 MG tablet Take 500 mg by mouth 4 (four) times daily  predniSONE (DELTASONE) 10 MG tablet 3 tabs daily for 3 days, 2 tab daily for 3 days, 1 tab  for 3 day 18 tablet 0  RED YEAST RICE ORAL Take 2 tablets by mouth once daily.    No current Epic-ordered facility-administered medications on file.   Allergies: Allergies  Allergen Reactions  Statins-Hmg-Coa Reductase Inhibitors Muscle Pain    Review of Systems:  A comprehensive 14 point ROS was performed, reviewed, and the pertinent orthopaedic findings are documented in the HPI.  Physical Exam: Body mass index is 28.19 kg/m. General/Constitutional: No apparent distress: well-nourished and well  developed. Lymphatic: No palpable adenopathy. Pulmonary exam: Lungs clear to auscultation bilaterally no wheezing rales or rhonchi Cardiac exam: Regular rate and rhythm no obvious murmurs rubs or gallops. Vascular: No edema, swelling or tenderness, except as noted in detailed exam. Integumentary: No impressive skin lesions present, except as noted in detailed exam. Neuro/Psych: Normal mood and affect, oriented to person, place and time. Musculoskeletal: Normal, except as noted in detailed exam and in HPI.  Left hip exam  SKIN: intact SWELLING: none WARMTH: no warmth TENDERNESS: Minimal tenderness over the posterior lateral hip, Stinchfield Positive ROM: 0 degrees internal rotation and 0 degrees external rotation and pain with internal rotation localized to the groin,; Hip Flexion 80 degrees limited by pain  STRENGTH: normal GAIT: antalgic with a pain STABILITY: stable to testing CREPITUS: yes LEG LENGTH DISCREPANCY: right longer by .5 cm NEUROLOGICAL EXAM: normal VASCULAR EXAM: normal LUMBAR SPINE: tenderness: no straight leg raising sign: no motor exam: normal  The contralateral hip was examined for comparison and it showed: TENDERNESS: none ROM: normal and full STRENGTH: normal STABILITY: stable to testing  Hip Imaging :  I reviewed AP pelvis, left hip AP, left hip frog-leg lateral performed in the internal medicine office on 04/22/2022 images and report reviewed by myself. Patient has moderate to severe degenerative arthritis of the left hip with joint space narrowing osteophyte formation, subchondral cysts, sclerosis, and mild lateral subluxation of the femoral head secondary to medial osteophytes. There is also some small enthesophytes at the tip of the abductor musculature around the greater trochanter. 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 The  implants appear stable with no evidence of any periprosthetic fracture or loosening at this time. Agree with radiologist evaluation.   Assessment:  Left hip osteoarthritis  Plan: Vincent Small is a 77 year old male who presents with left hip bone on bone arthritis. Based upon the patient's continued symptoms and failure to respond to conservative treatment, I have recommended a left total hip replacement for this patient. A long discussion took place with the patient describing what a total joint replacement is and what the procedure would entail. A hip model, similar to the implants that will be used during the operation, was utilized to demonstrate the implants. Choices of implant manufactures were discussed and reviewed. The ability to secure the implant utilizing cement or cementless (press fit) fixation was discussed. Anterior and posterior exposures were discussed. For this patient an appropriate approach will be anterior.  The hospitalization and post-operative care and rehabilitation were also discussed. The use of perioperative antibiotics and DVT prophylaxis were discussed. The risk, benefits and alternatives to a surgical intervention were discussed at length with the patient. The patient was also advised of risks related to the medical comorbidities and elevated body mass index (BMI). A lengthy discussion took place to review the most common complications including but not limited to: deep vein thrombosis, pulmonary embolus, heart attack, stroke, infection,  wound breakdown, heterotopic ossification, dislocation, numbness, leg length in-equality, intraoperative fracture, damage to nerves, tendon,muscles, arteries or other blood vessels, death and other possible complications from anesthesia. The patient was told that we will take steps to minimize these risks by using sterile technique, antibiotics and DVT prophylaxis when appropriate and follow the patient postoperatively in the office setting to monitor  progress. The possibility of recurrent pain, no improvement in pain and actual worsening of pain were also discussed with the patient. The risk of dislocation following total hip replacement was discussed and potential precautions to prevent dislocation were reviewed.   The discharge plan of care focused on the patient going home following surgery. The patient was encouraged to make the necessary arrangements to have someone stay with them when they are discharged home.   The benefits of surgery were discussed with the patient including the potential for improving the patient's current clinical condition through operative intervention. Alternatives to surgical intervention including continued conservative management were also discussed in detail. All questions were answered to the satisfaction of the patient. The patient participated and agreed to the plan of care as well as the use of the recommended implants for their total hip replacement surgery. An information packet was given to the patient to review prior to surgery.   The patient received medical clearance for surgery. All questions answered the patient agrees to the above plan to proceed with a left anterior total hip replacement.  Portions of this record have been created using [employer]. Dictation errors have been sought, but may not have been identified and corrected.  Steffanie Rainwater MD

## 2022-08-08 NOTE — Anesthesia Preprocedure Evaluation (Signed)
Anesthesia Evaluation  Patient identified by MRN, date of birth, ID band Patient awake    Reviewed: Allergy & Precautions, NPO status , Patient's Chart, lab work & pertinent test results  History of Anesthesia Complications Negative for: history of anesthetic complications  Airway Mallampati: III  TM Distance: <3 FB Neck ROM: full    Dental  (+) Chipped   Pulmonary neg shortness of breath, asthma    Pulmonary exam normal        Cardiovascular Exercise Tolerance: Good hypertension, (-) angina (-) Past MI Normal cardiovascular exam     Neuro/Psych negative neurological ROS  negative psych ROS   GI/Hepatic negative GI ROS, Neg liver ROS,neg GERD  ,,  Endo/Other  diabetes, Type 2    Renal/GU      Musculoskeletal   Abdominal   Peds  Hematology negative hematology ROS (+)   Anesthesia Other Findings Past Medical History: No date: Arthritis No date: Asthma     Comment:  as a child No date: Basal cell carcinoma No date: Cellulitis No date: Chronic pain of left knee No date: Diabetes mellitus without complication (HCC) No date: Hypertension No date: Infected sebaceous cyst No date: Mixed hyperlipidemia No date: Myalgia No date: Spinal stenosis of lumbar region with neurogenic claudication  Past Surgical History: No date: COLONOSCOPY No date: HERNIA REPAIR; Right     Comment:  inguinal x 2 04/14/2021: LUMBAR LAMINECTOMY/DECOMPRESSION MICRODISCECTOMY; N/A     Comment:  Procedure: L4-S1 DECOMPRESSION;  Surgeon: Meade Maw, MD;  Location: [facility] ORS;  Service: Neurosurgery;              Laterality: N/A; No date: PILONIDAL CYST EXCISION No date: TONSILLECTOMY No date: TOTAL HIP ARTHROPLASTY; Right     Comment:  2010  BMI    Body Mass Index: 24.94 kg/m      Reproductive/Obstetrics negative OB ROS                             Anesthesia Physical Anesthesia  Plan  ASA: 3  Anesthesia Plan: Spinal   Post-op Pain Management:    Induction:   PONV Risk Score and Plan:   Airway Management Planned: Natural Airway and Nasal Cannula  Additional Equipment:   Intra-op Plan:   Post-operative Plan:   Informed Consent: I have reviewed the patients History and Physical, chart, labs and discussed the procedure including the risks, benefits and alternatives for the proposed anesthesia with the patient or authorized representative who has indicated his/her understanding and acceptance.     Dental Advisory Given  Plan Discussed with: Anesthesiologist, CRNA and Surgeon  Anesthesia Plan Comments: (Patient reports no bleeding problems and no anticoagulant use.  Plan for spinal with backup GA  Patient consented for risks of anesthesia including but not limited to:  - adverse reactions to medications - damage to eyes, teeth, lips or other oral mucosa - nerve damage due to positioning  - risk of bleeding, infection and or nerve damage from spinal that could lead to paralysis - risk of headache or failed spinal - damage to teeth, lips or other oral mucosa - sore throat or hoarseness - damage to heart, brain, nerves, lungs, other parts of body or loss of life  Patient voiced understanding.)       Anesthesia Quick Evaluation

## 2022-08-08 NOTE — Plan of Care (Signed)
  Problem: Education: Goal: Ability to describe self-care measures that may prevent or decrease complications (Diabetes Survival Skills Education) will improve Outcome: Progressing   Problem: Coping: Goal: Ability to adjust to condition or change in health will improve Outcome: Progressing   Problem: Health Behavior/Discharge Planning: Goal: Ability to identify and utilize available resources and services will improve Outcome: Progressing Goal: Ability to manage health-related needs will improve Outcome: Progressing   Problem: Metabolic: Goal: Ability to maintain appropriate glucose levels will improve Outcome: Progressing   Problem: Skin Integrity: Goal: Risk for impaired skin integrity will decrease Outcome: Progressing

## 2022-08-08 NOTE — Transfer of Care (Signed)
Immediate Anesthesia Transfer of Care Note  Patient: Vincent Small  Procedure(s) Performed: TOTAL HIP ARTHROPLASTY ANTERIOR APPROACH (Left: Hip)  Patient Location: PACU  Anesthesia Type:General  Level of Consciousness: drowsy  Airway & Oxygen Therapy: Patient Spontanous Breathing and Patient connected to face mask oxygen  Post-op Assessment: Report given to RN and Post -op Vital signs reviewed and stable  Post vital signs: Reviewed and stable  Last Vitals:  Vitals Value Taken Time  BP 157/81 08/08/22 1015  Temp 36.6 C 08/08/22 1015  Pulse 90 08/08/22 1017  Resp 19 08/08/22 1017  SpO2 100 % 08/08/22 1017  Vitals shown include unvalidated device data.  Last Pain:  Vitals:   08/08/22 1015  TempSrc:   PainSc: Asleep         Complications: No notable events documented.

## 2022-08-08 NOTE — Evaluation (Signed)
Physical Therapy Evaluation Patient Details Name: Vincent Small MRN: [id number] DOB: 07-27-1945 Today's Date: 08/08/2022  History of Present Illness  Pt is a 77 y.o. male s/p L anterior THA on 08/08/22  Clinical Impression  Pt admitted with above diagnosis. Pt received upright in bed agreeable to PT services with spouse present. Pt and spouse report at baseline pt is independent with mobility, ADL's/IADL's. Pt educated on WB status, and general positions to limit prior to mobility. Limited participation noted with LE therex in bed anticipate due to medications but has normal sensation and LLE use.  To date, pt appearing drowsy with difficulty keeping eyes open intermittently but improves as pt transfers to EOB. Pt reliant on minA and bed features to transfer to sitting with VC's for UE use on bed rails.  Pt maintaining static sitting endorsing N/V with emesis bag provided with 2-3 small bouts of vomiting performed. Pt denying dizziness or lightheadedness and after emesis stating nausea is improved. Pt provided with education and min multimodal cuing for hand positioning for STS wwith minA provided with initial 102 bouts of LE's buckling with mod VC's needed to perform B knee and hip extension. After 20-30 sec standing this improves and pt able to  maintain static standing balance and perform step pivot with correct RW use and LE sequencing and good hand placement and eccentric control. Pt left in care of RN and NT. Anticipate pending safe gait and stairs training pt will be safe to d/c home with [facility] PT services. Pt currently with functional limitations due to the deficits listed below (see PT Problem List). Pt will benefit from skilled PT to increase their independence and safety with mobility to allow discharge to the venue listed below.        Recommendations for follow up therapy are one component of a multi-disciplinary discharge planning process, led by the attending physician.  Recommendations may be  updated based on patient status, additional functional criteria and insurance authorization.  Follow Up Recommendations Home health PT      Assistance Recommended at Discharge Intermittent Supervision/Assistance  Patient can return home with the following  A little help with walking and/or transfers;Assistance with cooking/housework;Assist for transportation;A little help with bathing/dressing/bathroom;Help with stairs or ramp for entrance    Equipment Recommendations None recommended by PT  Recommendations for Other Services       Functional Status Assessment Patient has had a recent decline in their functional status and demonstrates the ability to make significant improvements in function in a reasonable and predictable amount of time.     Precautions / Restrictions Precautions Precautions: Anterior Hip Precaution Booklet Issued: Yes (comment) Restrictions Weight Bearing Restrictions: Yes LLE Weight Bearing: Weight bearing as tolerated      Mobility  Bed Mobility Overal bed mobility: Needs Assistance Bed Mobility: Supine to Sit     Supine to sit: Min assist, HOB elevated     General bed mobility comments: VC's for sequencing, bed features, and minA at LLE to sit EOB Patient Response: Cooperative  Transfers Overall transfer level: Needs assistance Equipment used: Rolling walker (2 wheels) Transfers: Sit to/from Stand, Bed to chair/wheelchair/BSC Sit to Stand: Min assist   Step pivot transfers: Min guard       General transfer comment: VC's for hand placement to safely stand    Ambulation/Gait                  Stairs  Wheelchair Mobility    Modified Rankin (Stroke Patients Only)       Balance Overall balance assessment: Needs assistance Sitting-balance support: Feet supported, No upper extremity supported Sitting balance-Leahy Scale: Fair       Standing balance-Leahy Scale: Poor Standing balance comment: reliant on RW,  buckling of LE's in standing initially relying on UE support to recover.                             Pertinent Vitals/Pain Pain Assessment Pain Assessment: Faces Faces Pain Scale: Hurts a little bit Pain Location: L hip Pain Descriptors / Indicators: Discomfort Pain Intervention(s): Limited activity within patient's tolerance, Monitored during session, Premedicated before session, Repositioned, Ice applied    Home Living Family/patient expects to be discharged to:: Private residence Living Arrangements: Spouse/significant other Available Help at Discharge: Family;Available 24 hours/day Type of Home: House Home Access: Stairs to enter Entrance Stairs-Rails: Right;Left;Can reach both Entrance Stairs-Number of Steps: 4   Home Layout: One level Home Equipment: Conservation officer, nature (2 wheels);Toilet riser;Shower seat      Prior Function Prior Level of Function : Independent/Modified Independent                     Hand Dominance        Extremity/Trunk Assessment   Upper Extremity Assessment Upper Extremity Assessment: Overall WFL for tasks assessed    Lower Extremity Assessment Lower Extremity Assessment: Generalized weakness;LLE deficits/detail LLE Deficits / Details: L THA LLE Sensation: WNL    Cervical / Trunk Assessment Cervical / Trunk Assessment: Normal  Communication   Communication: No difficulties;HOH  Cognition Arousal/Alertness: Awake/alert, Suspect due to medications Behavior During Therapy: WFL for tasks assessed/performed Overall Cognitive Status: Within Functional Limits for tasks assessed                                 General Comments: a little drowsy with difficulty keeping eyes open but follows commands, able to participate fully.        General Comments      Exercises Total Joint Exercises Ankle Circles/Pumps: AROM, Strengthening, Both, 10 reps, Supine Hip ABduction/ADduction: AROM, Strengthening, Left, 10 reps,  Supine Other Exercises Other Exercises: Role of PT in acute setting, d/c recs, WB status, Provided HEP packet with education on reps/sets/frequency   Assessment/Plan    PT Assessment Patient needs continued PT services  PT Problem List Decreased strength;Decreased activity tolerance;Decreased safety awareness;Decreased balance;Decreased mobility;Pain       PT Treatment Interventions DME instruction;Balance training;Gait training;Neuromuscular re-education;Stair training;Functional mobility training;Patient/family education;Therapeutic activities;Therapeutic exercise    PT Goals (Current goals can be found in the Care Plan section)  Acute Rehab PT Goals Patient Stated Goal: to go home PT Goal Formulation: With patient/family Time For Goal Achievement: 08/22/22 Potential to Achieve Goals: Good    Frequency BID     Co-evaluation               AM-PAC PT "6 Clicks" Mobility  Outcome Measure Help needed turning from your back to your side while in a flat bed without using bedrails?: A Little Help needed moving from lying on your back to sitting on the side of a flat bed without using bedrails?: A Little Help needed moving to and from a bed to a chair (including a wheelchair)?: A Little Help needed standing up from a chair using your arms (e.g.,  wheelchair or bedside chair)?: A Little Help needed to walk in hospital room?: A Lot Help needed climbing 3-5 steps with a railing? : A Lot 6 Click Score: 16    End of Session Equipment Utilized During Treatment: Gait belt Activity Tolerance: Patient tolerated treatment well Patient left: in chair;with call bell/phone within reach;with family/visitor present;with nursing/sitter in room Nurse Communication: Mobility status PT Visit Diagnosis: Other abnormalities of gait and mobility (R26.89);Muscle weakness (generalized) (M62.81);Difficulty in walking, not elsewhere classified (R26.2);Unsteadiness on feet (R26.81);Pain Pain -  Right/Left: Left Pain - part of body: Hip    Time: 1420-1443 PT Time Calculation (min) (ACUTE ONLY): 23 min   Charges:   PT Evaluation $PT Eval Low Complexity: 1 Low PT Treatments $Therapeutic Activity: 8-22 mins        Mahati Vajda M. Fairly IV, PT, DPT Physical Therapist- [facility]  08/08/2022, 2:58 PM

## 2022-08-08 NOTE — Anesthesia Procedure Notes (Signed)
Procedure Name: Intubation Date/Time: 08/08/2022 7:55 AM  Performed by: Gentry Fitz, CRNAPre-anesthesia Checklist: Patient identified, Emergency Drugs available, Suction available and Patient being monitored Patient Re-evaluated:Patient Re-evaluated prior to induction Oxygen Delivery Method: Circle system utilized Preoxygenation: Pre-oxygenation with 100% oxygen Induction Type: IV induction Ventilation: Mask ventilation without difficulty Laryngoscope Size: McGraph and 3 Grade View: Grade II Tube type: Oral Tube size: 7.5 mm Number of attempts: 1 Airway Equipment and Method: Stylet and Oral airway Placement Confirmation: ETT inserted through vocal cords under direct vision, positive ETCO2 and breath sounds checked- equal and bilateral Secured at: 22 cm Tube secured with: Tape Dental Injury: Teeth and Oropharynx as per pre-operative assessment

## 2022-08-08 NOTE — TOC Progression Note (Signed)
Transition of Care [facility]) - Progression Note    Patient Details  Name: Vincent Small MRN: [id number] Date of Birth: 01-06-1946  Transition of Care [facility]) CM/SW [place], RN Phone Number: 08/08/2022, 4:20 PM  Clinical Narrative:     Patient is set up with [place] for [facility] services, he has Rolling Walker (2 wheels);Toilet riser;Shower seat  at home    Barriers to Discharge: No Barriers Identified  Expected Discharge Plan and Services   Discharge Planning Services: CM Consult   Living arrangements for the past 2 months: Single Family Home                 DME Arranged: N/A DME Agency: NA Date DME Agency Contacted: 08/08/22 Time DME Agency Contacted: [id number]   [place] Arranged: PT, OT HH Agency: [place] Date [place]: 08/08/22 Time [place]: [id number] Representative spoke with at [place]: [place]   Social Determinants of Health ([place]) Interventions [place]: No Food Insecurity (08/08/2022)  Housing: Low Risk  (08/08/2022)  Transportation Needs: No Transportation Needs (08/08/2022)  Utilities: Not At Risk (08/08/2022)  Tobacco Use: Low Risk  (08/08/2022)    Readmission Risk Interventions     No data to display

## 2022-08-08 NOTE — Op Note (Signed)
Patient Name: Vincent Small  [id number]  Pre-Operative Diagnosis: Left hip Osteoarthritis  Post-Operative Diagnosis: (same)  Procedure: Left Total Hip Arthroplasty  Components/Implants: Cup: Trident Tritanium Clusterhole 831m w/x2 screws    Liner: MDM 42/E   Stem: Insignia #2 High Offset   Head:28/48 (42E) X3 MDM/ADM w/ 22m-31m81miolox Ceramic Head  Date of Surgery: 08/08/2022  Surgeon: ZacSteffanie Rainwater  Assistant: ThoDorise Hiss (present and scrubbed throughout the case, critical for assistance with exposure, retraction, instrumentation, and closure)   Anesthesiologist: P[place]ello  Anesthesia: General  EBL: 200cc  [id number]: None   Brief history: The patient is a 76 20ar old male with a history of osteoarthritis of the left hip with pain limiting their range of motion and activities of daily living, which has failed multiple attempts at conservative therapy.  The risks and benefits of total hip arthroplasty as definitive surgical treatment were discussed with the patient, who opted to proceed with the operation.  After outpatient medical clearance and optimization was completed the patient was [facility].  All preoperative films were reviewed and an appropriate surgical plan was made prior to surgery.   Description of procedure: The patient was brought to the operating room where laterality was confirmed by all those present to be the left side.  The patient was administered general anesthesia on a stretcher prior to being moved supine on the operating room table. Patient was given an intravenous dose of antibiotics for surgical prophylaxis and TXA.  All bony prominences and extremities were well padded and the patient was securely attached to the table boots, a perineal post was placed and the patient had a safety strap placed.  Surgical site was prepped with alcohol and chlorhexidine. The surgical site over the  hip was and draped in typical sterile fashion with multiple layers of adhesive and nonadhesive drapes.  The incision site was marked out with a sterile marker and care was taken to assess the position of the ASIS and ensure appropriate position for the incision.    A surgical timeout was then called with participation of all staff in the room the patient was then a confirmed again and laterality confirmed.  Incision was made over the anterior lateral aspect of the proximal thigh in line with the TFL.  Appropriate retractors were placed and all bleeding vessels were coagulated within the subcutaneous and fatty layers.  An incision was made in the TFL fascia in the interval was carefully identified.  The lateral ascending branches of the circumflex vessels were identified, cauterized and carefully dissected.  Retractors were placed around the superior lateral and inferior medial aspects of the femoral neck and a capsulotomy was performed exposing the hip joint.  Retraction stitches were placed and the capsulotomy to assist with visualization.  Femoral neck cut was then made and the femoral head was extracted after placing the leg in traction.  Bone wax was then applied to the proximal cut surface of the femur and aqua mantis was used to address any bleeding around the femoral neck cut.  Retractors were then placed around the acetabulum to fully visualize the joint space, and the remaining labral tissue was removed and pulvinar was removed.   The acetabulum was then sequentially reamed up to the appropriate size in order to get good fit and fill for the acetabular component while under fluoroscopic guidance.  Acetabular component was then placed and malleted into a secure fit while confirming position and abduction  angle and anteversion utilizing fluoroscopy.  2 screws were then placed in the acetabular cup to assist in securing the cup in place. The cup was then irrigated thoroughly and a a real MDM liner was  impacted into place and checked for stability. Attention was then moved to the femur. The femur traction was dropped and sequentially externally rotated while performing a release of the posterior and superomedial tissues off of the proximal femur to allow for mobility, care was taken to preserve the external rotators and piriformis attachments.  The remaining interval between the abductors and the capsule was dissected out and a retractor was placed over the superolateral aspect of the femur over the greater trochanter.  The leg was carefully brought down into extension and adducted to provide visualization of the proximal femur for broaching.  The femur was then sequentially broached up to an appropriate size which provided for good fill and stability to the femoral broach.  A trial neck and head were placed on the femoral broach and the leg was brought up for reduction.  The hip was reduced and manual check of stability was performed with the boot detached from the table.  The hip was found to be stable in flexion internal rotation and extension external rotation.  Leg lengths were confirmed on fluoroscopy.   The hip was then dislocated the trial neck and head were removed.  The trial acetabular liner was removed.  The hip was then irrigated with normal saline and the final poly liner was implanted.  The leg was then brought down into extension and adduction in the proximal femur was reexposed.  The broach trial was removed and the femur was irrigated with normal saline prior to the real femoral stem being implanted.  After the femoral stem was seated and shown to have good fit and fill the appropriate head was impacted the leg was brought up and reduced.  There was good range of motion with stability in flexion internal rotation and extension external rotation on testing.  Leg lengths were found to be appropriate on fluoroscopic evaluation at this time.  The hip was then irrigated with betdine based surgiphor  solution and then saline solution.  The capsulotomy was repaired with Ethibond sutures.  A pericapsular and peritrochanteric cocktail with Exparel and bupivacaine was then injected as well as the subcutaneous tissues. The fascia was closed with a #2 barbed running suture.  The deep tissues were closed with Vicryl sutures the subcutaneous tissues were closed with interrupted Vicryl sutures and a running barbed 3-0 suture.  The skin was then reinforced with Dermabond and a sterile dressing was placed.   The patient was awoken from anesthesia transferred off of the operating room table onto a hospital bed where examination of leg lengths found the leg lengths to be equal with a good distal pulse.  The patient was then transferred to the PACU in stable condition.

## 2022-08-08 NOTE — Interval H&P Note (Signed)
Patient history and physical updated. Consent reviewed including risks, benefits, and alternatives to surgery. Patient agrees with above plan to proceed with left anterior total hip arthroplasty.

## 2022-08-08 NOTE — Progress Notes (Signed)
Remains anxious, states he didn't sleep last night.  Patient is tolerating saline crackers and gingerale without event. Will continue to monitor. Family updated

## 2022-08-08 NOTE — Progress Notes (Signed)
Patient awake/alert x4.  Very anxious, tearful at intervals, when questioned, patient can not give an answer.  Medicated as ordered for discomfort. Asked if patient wanted to order lunch, "I don't think so"  Will continue to monitor.

## 2022-08-09 ENCOUNTER — Encounter: Payer: Self-pay | Admitting: Orthopedic Surgery

## 2022-08-09 DIAGNOSIS — M1612 Unilateral primary osteoarthritis, left hip: Secondary | ICD-10-CM | POA: Diagnosis not present

## 2022-08-09 LAB — CBC
HCT: 30.2 % — ABNORMAL LOW (ref 39.0–52.0)
Hemoglobin: 10.2 g/dL — ABNORMAL LOW (ref 13.0–17.0)
MCH: 31 pg (ref 26.0–34.0)
MCHC: 33.8 g/dL (ref 30.0–36.0)
MCV: 91.8 fL (ref 80.0–100.0)
Platelets: 173 10*3/uL (ref 150–400)
RBC: 3.29 MIL/uL — ABNORMAL LOW (ref 4.22–5.81)
RDW: 12.1 % (ref 11.5–15.5)
WBC: 9.1 10*3/uL (ref 4.0–10.5)
nRBC: 0 % (ref 0.0–0.2)

## 2022-08-09 LAB — BASIC METABOLIC PANEL
Anion gap: 6 (ref 5–15)
BUN: 19 mg/dL (ref 8–23)
CO2: 25 mmol/L (ref 22–32)
Calcium: 7.7 mg/dL — ABNORMAL LOW (ref 8.9–10.3)
Chloride: 104 mmol/L (ref 98–111)
Creatinine, Ser: 0.93 mg/dL (ref 0.61–1.24)
GFR, Estimated: >60 mL/min (ref >60)
Glucose, Bld: 143 mg/dL — ABNORMAL HIGH (ref 70–99)
Potassium: 3.5 mmol/L (ref 3.5–5.1)
Sodium: 135 mmol/L (ref 135–145)

## 2022-08-09 LAB — GLUCOSE, CAPILLARY
Glucose-Capillary: 137 mg/dL — ABNORMAL HIGH (ref 70–99)
Glucose-Capillary: 161 mg/dL — ABNORMAL HIGH (ref 70–99)

## 2022-08-09 MED ORDER — CELECOXIB 200 MG PO CAPS: 200.0000 mg | ORAL_CAPSULE | Freq: Two times a day (BID) | ORAL | 0 refills | Status: AC

## 2022-08-09 MED ORDER — DOCUSATE SODIUM 100 MG PO CAPS: 100.0000 mg | ORAL_CAPSULE | Freq: Two times a day (BID) | ORAL | 0 refills | Status: DC

## 2022-08-09 MED ORDER — ACETAMINOPHEN 500 MG PO TABS: 1000.0000 mg | ORAL_TABLET | Freq: Three times a day (TID) | ORAL | 0 refills | Status: AC

## 2022-08-09 MED ORDER — TRAMADOL HCL 50 MG PO TABS: 50.0000 mg | ORAL_TABLET | Freq: Four times a day (QID) | ORAL | 0 refills | Status: DC | PRN

## 2022-08-09 MED ORDER — ONDANSETRON HCL 4 MG PO TABS: 4.0000 mg | ORAL_TABLET | Freq: Four times a day (QID) | ORAL | 0 refills | Status: DC | PRN

## 2022-08-09 MED ORDER — ENOXAPARIN SODIUM 40 MG/0.4ML IJ SOSY: 40.0000 mg | PREFILLED_SYRINGE | INTRAMUSCULAR | 0 refills | Status: DC

## 2022-08-09 NOTE — Plan of Care (Signed)
  Problem: Education: Goal: Ability to describe self-care measures that may prevent or decrease complications (Diabetes Survival Skills Education) will improve Outcome: Progressing   Problem: Coping: Goal: Ability to adjust to condition or change in health will improve Outcome: Progressing   Problem: Fluid Volume: Goal: Ability to maintain a balanced intake and output will improve Outcome: Progressing   Problem: Metabolic: Goal: Ability to maintain appropriate glucose levels will improve Outcome: Progressing   Problem: Nutritional: Goal: Maintenance of adequate nutrition will improve Outcome: Progressing Goal: Progress toward achieving an optimal weight will improve Outcome: Progressing   Problem: Tissue Perfusion: Goal: Adequacy of tissue perfusion will improve Outcome: Progressing   Problem: Education: Goal: Knowledge of the prescribed therapeutic regimen will improve Outcome: Progressing

## 2022-08-09 NOTE — Progress Notes (Signed)
DISCHARGE NOTE:   Pt discharged with belongings and instructions give. Doesn't voice any questions or concerns. Pt left with walker that was delivered. Transportation provided via pt's wife.

## 2022-08-09 NOTE — Discharge Instructions (Signed)
Instructions after Anterior Total Hip Replacement        Dr. Serita Butcher., M.D.      Dept. of [facility]  [address], [place]  [address]  Phone: [phone]   Fax: [phone]    DIET: Drink plenty of non-alcoholic fluids. Resume your normal diet. Include foods high in fiber.  ACTIVITY:  You may use crutches or a walker with weight-bearing as tolerated, unless instructed otherwise. You may be weaned off of the walker or crutches by your Physical Therapist.  Continue doing gentle exercises. Exercising will reduce the pain and swelling, increase motion, and prevent muscle weakness.   Please continue to use the TED compression stockings for 2 weeks. You may remove the stockings at night, but should reapply them in the morning. Do not drive or operate any equipment until instructed.  WOUND CARE:  Continue to use ice packs periodically to reduce pain and swelling. You may shower with honeycomb dressing 3 days after your surgery. Do not submerge incision site under water. Remove honeycomb dressing 7 days after surgery and allow dermabond to fall off on its own.   MEDICATIONS: You may resume your regular medications. Please take the pain medication as prescribed on the medication list. Do not take pain medication on an empty stomach. You have been given a prescription for a blood thinner to prevent blood clots. Please take the medication as instructed. (NOTE: After completing a 2 week course of Lovenox, take one Enteric-coated 81 mg aspirin twice a day.) Pain medications and iron supplements can cause constipation. Use a stool softener (Senokot or Colace) on a daily basis and a laxative (dulcolax or miralax) as needed. Do not drive or drink alcoholic beverages when taking pain medications.  POSTOPERATIVE CONSTIPATION PROTOCOL Constipation - defined medically as fewer than three stools per week and severe constipation as  less than one stool per week.  One of the most common issues patients have following surgery is constipation.  Even if you have a regular bowel pattern at home, your normal regimen is likely to be disrupted due to multiple reasons following surgery.  Combination of anesthesia, postoperative narcotics, change in appetite and fluid intake all can affect your bowels.  In order to avoid complications following surgery, here are some recommendations in order to help you during your recovery period.  Colace (docusate) - Pick up an over-the-counter form of Colace or another stool softener and take twice a day as long as you are requiring postoperative pain medications.  Take with a full glass of water daily.  If you experience loose stools or diarrhea, hold the colace until you stool forms back up.  If your symptoms do not get better within 1 week or if they get worse, check with your doctor.  Dulcolax (bisacodyl) - Pick up over-the-counter and take as directed by the product packaging as needed to assist with the movement of your bowels.  Take with a full glass of water.  Use this product as needed if not relieved by Colace only.   MiraLax (polyethylene glycol) - Pick up over-the-counter to have on hand.  MiraLax is a solution that will increase the amount of water in your bowels to assist with bowel movements.  Take as directed and can mix with a glass of water, juice, soda, coffee, or tea.  Take if you go more than two days without a movement. Do not use MiraLax more than once per day. Call  your doctor if you are still constipated or irregular after using this medication for 7 days in a row.  If you continue to have problems with postoperative constipation, please contact the office for further assistance and recommendations.  If you experience "the worst abdominal pain ever" or develop nausea or vomiting, please contact the office immediatly for further recommendations for treatment.   CALL THE OFFICE  FOR: Temperature above 101 degrees Excessive bleeding or drainage on the dressing. Excessive swelling, coldness, or paleness of the toes. Persistent nausea and vomiting.  FOLLOW-UP:  You should have an appointment to return to the office in 2 weeks after surgery. Arrangements have been made for continuation of Physical Therapy (either home therapy or outpatient therapy).

## 2022-08-09 NOTE — TOC Progression Note (Signed)
Transition of Care [facility]) - Progression Note    Patient Details  Name: Vincent Small MRN: [id number] Date of Birth: 27-Jun-1945  Transition of Care [facility]) CM/SW [place], RN Phone Number: 08/09/2022, 11:00 AM  Clinical Narrative:    Patient is set up with [facility] [employer] to deliver RW to the bedside   Expected Discharge Plan: [place] Barriers to Discharge: No Barriers Identified  Expected Discharge Plan and Services   Discharge Planning Services: CM Consult   Living arrangements for the past 2 months: Single Family Home                 DME Arranged: Walker rolling DME Agency: [employer] Date DME Agency Contacted: 08/09/22 Time DME Agency Contacted: 1059 Representative spoke with at DME Agency: Intake HH Arranged: PT, OT [place] Agency: [place] Date [place]: 08/08/22 Time [place]: [id number] Representative spoke with at [place]: [place]   Social Determinants of Health ([place]) Interventions SDOH Screenings   Food Insecurity: No Food Insecurity (08/08/2022)  Housing: Low Risk  (08/08/2022)  Transportation Needs: No Transportation Needs (08/08/2022)  Utilities: Not At Risk (08/08/2022)  Tobacco Use: Low Risk  (08/08/2022)    Readmission Risk Interventions     No data to display

## 2022-08-09 NOTE — Progress Notes (Signed)
Physical Therapy Treatment Patient Details Name: Vincent Small MRN: [id number] DOB: Oct 08, 1945 Today's Date: 08/09/2022   History of Present Illness Pt is a 77 y.o. male s/p L anterior THA on 08/08/22    PT Comments    Pt received supine in bed agreeable to PT. Pt declines LLE pain today exiting L side of bed mod-I. Pt able to stand minguard and ambulate with RW at minguard, progressing to supervision level. Pt has 1 minor buckling of LE's but with RW pt able to correct himself and complete > 200' of step through gait at supervision level and correct RW use.  Pt provided seated rest with education and PT demo on asc/desc steps using spouse for assist. Pt able to complete safely with correct sequencing with min VC's and excellent balance/strength to complete safely returning to room. Pt's spouse entering room with pt and spouse educated on car transfer once pt seated in recliner. Educated pt and spouse on afternoon session as spouse was not present for stairs training and how to assist pt with guarding and managing RW. After afternoon session pt will be safe to d/c home with [facility] services. All needs in reach in recliner.    Recommendations for follow up therapy are one component of a multi-disciplinary discharge planning process, led by the attending physician.  Recommendations may be updated based on patient status, additional functional criteria and insurance authorization.  Follow Up Recommendations  Home health PT     Assistance Recommended at Discharge Intermittent Supervision/Assistance  Patient can return home with the following A little help with walking and/or transfers;Assistance with cooking/housework;Assist for transportation;A little help with bathing/dressing/bathroom;Help with stairs or ramp for entrance   Equipment Recommendations  Rolling walker (2 wheels)    Recommendations for Other Services       Precautions / Restrictions Precautions Precautions: Anterior Hip Precaution  Booklet Issued: Yes (comment) Restrictions Weight Bearing Restrictions: Yes LLE Weight Bearing: Weight bearing as tolerated     Mobility  Bed Mobility Overal bed mobility: Modified Independent             General bed mobility comments: demoing ability to enter/exit bed x2 without bed rail use or need for external assist Patient Response: Cooperative  Transfers Overall transfer level: Needs assistance Equipment used: Rolling walker (2 wheels) Transfers: Sit to/from Stand Sit to Stand: Min guard           General transfer comment: VC's for hand placement to safely stand    Ambulation/Gait Ambulation/Gait assistance: Supervision Gait Distance (Feet): 220 Feet Assistive device: Rolling walker (2 wheels) Gait Pattern/deviations: Step-through pattern, Decreased step length - right, Decreased step length - left, Decreased stance time - left       General Gait Details: pt mildly unsteady with gait bout with LE buckling but pt able to correct with absence of LE buckling remainder of standing and ambulation. Step through gait throughout with mild antalgia on LLE in stance phase.   Stairs Stairs: Yes Stairs assistance: Supervision Stair Management: Two rails, Step to pattern, Forwards Number of Stairs: 4 General stair comments: asc/desc x1 with correct LE sequencing with VC's. Completes safely.   Wheelchair Mobility    Modified Rankin (Stroke Patients Only)       Balance Overall balance assessment: Needs assistance Sitting-balance support: Feet supported, No upper extremity supported Sitting balance-Leahy Scale: Fair       Standing balance-Leahy Scale: Fair Standing balance comment: can stand at [place] without UE support  Cognition Arousal/Alertness: Awake/alert Behavior During Therapy: WFL for tasks assessed/performed Overall Cognitive Status: Within Functional Limits for tasks assessed                                           Exercises Total Joint Exercises Ankle Circles/Pumps: AROM, Strengthening, Both, 10 reps, Supine Quad Sets: Strengthening, Left, 10 reps, Supine Short Arc Quad: AROM, Strengthening, Left, 10 reps, Supine Heel Slides: AROM, Strengthening, Left, 10 reps, Supine Hip ABduction/ADduction: AROM, Strengthening, Left, 10 reps, Supine Long Arc Quad: AROM, Strengthening, Left, 10 reps, Supine Marching in Standing: AROM, Strengthening, Both, 10 reps, Seated Other Exercises Other Exercises: car transfer, education on reps/sets/frequency of HEP    General Comments        Pertinent Vitals/Pain Pain Assessment Pain Assessment: No/denies pain    Home Living                          Prior Function            PT Goals (current goals can now be found in the care plan section) Acute Rehab PT Goals Patient Stated Goal: to go home PT Goal Formulation: With patient/family Time For Goal Achievement: 08/22/22 Potential to Achieve Goals: Good Progress towards PT goals: Progressing toward goals    Frequency    BID      PT Plan Current plan remains appropriate    Co-evaluation              AM-PAC PT "6 Clicks" Mobility   Outcome Measure  Help needed turning from your back to your side while in a flat bed without using bedrails?: None Help needed moving from lying on your back to sitting on the side of a flat bed without using bedrails?: A Little Help needed moving to and from a bed to a chair (including a wheelchair)?: A Little Help needed standing up from a chair using your arms (e.g., wheelchair or bedside chair)?: A Little Help needed to walk in hospital room?: A Little Help needed climbing 3-5 steps with a railing? : A Little 6 Click Score: 19    End of Session Equipment Utilized During Treatment: Gait belt Activity Tolerance: Patient tolerated treatment well Patient left: in chair;with call bell/phone within reach;with family/visitor  present;with chair alarm set Nurse Communication: Mobility status PT Visit Diagnosis: Other abnormalities of gait and mobility (R26.89);Muscle weakness (generalized) (M62.81);Difficulty in walking, not elsewhere classified (R26.2);Unsteadiness on feet (R26.81);Pain Pain - Right/Left: Left Pain - part of body: Hip     Time: [id number] PT Time Calculation (min) (ACUTE ONLY): 30 min  Charges:  $Gait Training: 8-22 mins $Therapeutic Exercise: 8-22 mins                     Miara Emminger M. Fairly IV, PT, DPT Physical Therapist- [facility]  08/09/2022, 11:12 AM

## 2022-08-09 NOTE — Discharge Summary (Signed)
Physician Discharge Summary  Patient ID: Vincent Small MRN: [id number] DOB/AGE: 1945/06/29 77 y.o.  Admit date: 08/08/2022 Discharge date: 08/09/2022  Admission Diagnoses:  Osteoarthritis of left hip [M16.12]   Discharge Diagnoses: Patient Active Problem List   Diagnosis Date Noted   Osteoarthritis of left hip 08/08/2022    Past Medical History:  Diagnosis Date   Arthritis    Asthma    as a child   Basal cell carcinoma    Cellulitis    Chronic pain of left knee    Diabetes mellitus without complication (HCC)    Hypertension    Infected sebaceous cyst    Mixed hyperlipidemia    Myalgia    Spinal stenosis of lumbar region with neurogenic claudication      Transfusion: None   Consultants (if any):   Discharged Condition: Improved  Hospital Course: ISTVAN WACHTEL is an 77 y.o. male who was admitted 08/08/2022 with a diagnosis of Osteoarthritis of left hip and went to the operating room on 08/08/2022 and underwent the above named procedures.    Surgeries: Procedure(s): TOTAL HIP ARTHROPLASTY ANTERIOR APPROACH on 08/08/2022 Patient tolerated the surgery well. Taken to PACU where she was stabilized and then transferred to the orthopedic floor.  Started on Lovenox 40 mg q 24 hrs. TEDs and SCDs applied bilaterally. Heels elevated on bed. No evidence of DVT. Negative Homan. Physical therapy started on day #1 for gait training and transfer. OT started day #1 for ADL and assisted devices. Patient's IV was d/c on day #1. Patient was able to safely and independently complete all PT goals. PT recommending discharge to home.  On post op day #1 patient was stable and ready for discharge to home with home health PT.  Implants: Cup: Trident Tritanium Clusterhole 39m w/x2 screws    Liner: MDM 42/E   Stem: Insignia #2 High Offset   Head:28/48 (42E) X3 MDM/ADM w/ 286m-38m55miolox Ceramic Head   He was given perioperative antibiotics:  Anti-infectives (From admission, onward)    Start     Dose/Rate  Route Frequency Ordered Stop   08/08/22 1345  ceFAZolin (ANCEF) IVPB 2g/100 mL premix        2 g 200 mL/hr over 30 Minutes Intravenous Every 6 hours 08/08/22 1335 08/08/22 2044   08/08/22 0639  ceFAZolin (ANCEF) 2-4 GM/100ML-% IVPB       Note to Pharmacy: RobJordan Hawks cabinet override      08/08/22 0639 08/08/22 1431   08/08/22 0600  ceFAZolin (ANCEF) IVPB 2g/100 mL premix        2 g 200 mL/hr over 30 Minutes Intravenous On call to O.R. 08/08/22 0010 08/08/22 0800     .  He was given sequential compression devices, early ambulation, and Lovenox, teds for DVT prophylaxis.  He benefited maximally from the hospital stay and there were no complications.    Recent vital signs:  Vitals:   08/09/22 0455 08/09/22 0722  BP: 124/66 117/62  Pulse: 87 69  Resp:  16  Temp: 98.5 F (36.9 C) 97.8 F (36.6 C)  SpO2: 97% 98%    Recent laboratory studies:  Lab Results  Component Value Date   HGB 10.2 (L) 08/09/2022   HGB 13.8 04/07/2021   Lab Results  Component Value Date   WBC 9.1 08/09/2022   PLT 173 08/09/2022   Lab Results  Component Value Date   INR 1.1 04/07/2021   Lab Results  Component Value Date   NA 135 08/09/2022  K 3.5 08/09/2022   CL 104 08/09/2022   CO2 25 08/09/2022   BUN 19 08/09/2022   CREATININE 0.93 08/09/2022   GLUCOSE 143 (H) 08/09/2022    Discharge Medications:   Allergies as of 08/09/2022       Reactions   Statins    Muscle Pain        Medication List     STOP taking these medications    meloxicam 15 MG tablet Commonly known as: MOBIC       TAKE these medications    acetaminophen 500 MG tablet Commonly known as: TYLENOL Take 2 tablets (1,000 mg total) by mouth every 8 (eight) hours. What changed:  medication strength how much to take when to take this reasons to take this   celecoxib 200 MG capsule Commonly known as: CeleBREX Take 1 capsule (200 mg total) by mouth 2 (two) times daily for 14 days.   docusate  sodium 100 MG capsule Commonly known as: COLACE Take 1 capsule (100 mg total) by mouth 2 (two) times daily.   enoxaparin 40 MG/0.4ML injection Commonly known as: LOVENOX Inject 0.4 mLs (40 mg total) into the skin daily for 14 days.   ezetimibe 10 MG tablet Commonly known as: ZETIA Take 10 mg by mouth daily.   glimepiride 2 MG tablet Commonly known as: AMARYL Take 2 mg by mouth every morning.   hydrochlorothiazide 12.5 MG capsule Commonly known as: MICROZIDE Take 12.5 mg by mouth daily.   losartan 100 MG tablet Commonly known as: COZAAR Take 100 mg by mouth daily.   ondansetron 4 MG tablet Commonly known as: ZOFRAN Take 1 tablet (4 mg total) by mouth every 6 (six) hours as needed for nausea.   OVER THE COUNTER MEDICATION Take 1 tablet by mouth as needed. bio-fisetin otc supplement   Red Yeast Rice 600 MG Caps Take 1,200 mg by mouth 2 (two) times a week.   timolol 0.5 % ophthalmic solution Commonly known as: BETIMOL Place 1 drop into both eyes daily.   traMADol 50 MG tablet Commonly known as: ULTRAM Take 1 tablet (50 mg total) by mouth every 6 (six) hours as needed for moderate pain.        Diagnostic Studies: DG HIP UNILAT WITH PELVIS 1V LEFT  Result Date: 08/08/2022 CLINICAL DATA:  Status post left total hip arthroplasty. EXAM: DG HIP (WITH OR WITHOUT PELVIS) 1V*L* COMPARISON:  None FINDINGS: Six images from portable C-arm radiography obtained in the operating room show interval left hip arthroplasty. The hardware components are in anatomic alignment. No signs of periprosthetic fracture or subluxation. IMPRESSION: Status post left hip arthroplasty. Electronically Signed   By: Kerby Moors M.D.   On: 08/08/2022 10:17   DG C-Arm 1-60 Min-No Report  Result Date: 08/08/2022 Fluoroscopy was utilized by the requesting physician.  No radiographic interpretation.   DG C-Arm 1-60 Min-No Report  Result Date: 08/08/2022 Fluoroscopy was utilized by the requesting  physician.  No radiographic interpretation.    Disposition:      Follow-up Information     Duanne Guess, PA-C Follow up in 2 week(s).   Specialties: Orthopedic Surgery, Emergency Medicine Contact information: [place] [address] [phone]                  Signed: Feliberto Gottron 08/09/2022, 7:42 AM

## 2022-08-09 NOTE — Progress Notes (Signed)
Physical Therapy Treatment Patient Details Name: Vincent Small MRN: [id number] DOB: [birth year] Today's Date: 08/09/2022   History of Present Illness Pt is a 77 y.o. male s/p L anterior THA on 08/08/22    PT Comments    Pt received upright in bed with spouse present. Agreeable to stairs training to ensure pt is aware how to best support safe entry into home. Remains mod-I with bed mobility and transferring with supervision. Spouse educated on assisting pt with RW placement, how to guard entering/exiting home. Pt completes steps again at supervision level, excellent understanding of sequencing. Spouse and pt verbalize understanding. Pt completes ~160' of gait with step through gait at supervision level and excellent use of RW. Pt sits EOB in care of spouse. Pt and spouse ready for d/c and RN aware. Pt has completed all PT goals. Safe to d/c home with [facility] PT services. Pt has needed DME. RW was adjusted for pt for safe home use.     Recommendations for follow up therapy are one component of a multi-disciplinary discharge planning process, led by the attending physician.  Recommendations may be updated based on patient status, additional functional criteria and insurance authorization.  Follow Up Recommendations  Home health PT     Assistance Recommended at Discharge Intermittent Supervision/Assistance  Patient can return home with the following A little help with walking and/or transfers;Assistance with cooking/housework;Assist for transportation;A little help with bathing/dressing/bathroom;Help with stairs or ramp for entrance   Equipment Recommendations  Rolling walker (2 wheels)    Recommendations for Other Services       Precautions / Restrictions Precautions Precautions: Anterior Hip Precaution Booklet Issued: Yes (comment) Restrictions Weight Bearing Restrictions: Yes LLE Weight Bearing: Weight bearing as tolerated     Mobility  Bed Mobility Overal bed mobility: Modified  Independent             General bed mobility comments: demoing ability to enter/exit bed x2 without bed rail use or need for external assist Patient Response: Cooperative  Transfers Overall transfer level: Needs assistance Equipment used: Rolling walker (2 wheels) Transfers: Sit to/from Stand Sit to Stand: Supervision           General transfer comment: safe hand placement.    Ambulation/Gait Ambulation/Gait assistance: Supervision Gait Distance (Feet): 160 Feet Assistive device: Rolling walker (2 wheels) Gait Pattern/deviations: Step-through pattern, Decreased step length - right, Decreased step length - left, Decreased stance time - left       General Gait Details: no buckling of LE's. Completes step through gait with excellent use of RW.   Stairs Stairs: Yes Stairs assistance: Supervision Stair Management: Two rails, Step to pattern, Forwards Number of Stairs: 4 General stair comments: asc/desc x1 with correct LE sequencing with VC's. Completes safely. Spouse present educated on assisting with RW placement and guarding techniques.   Wheelchair Mobility    Modified Rankin (Stroke Patients Only)       Balance Overall balance assessment: Needs assistance Sitting-balance support: Feet supported, No upper extremity supported Sitting balance-Leahy Scale: Fair       Standing balance-Leahy Scale: Fair Standing balance comment: can stand at RW without UE support                            Cognition Arousal/Alertness: Awake/alert Behavior During Therapy: WFL for tasks assessed/performed Overall Cognitive Status: Within Functional Limits for tasks assessed  Exercises Total Joint Exercises Ankle Circles/Pumps: AROM, Strengthening, Both, 10 reps, Supine Quad Sets: Strengthening, Left, 10 reps, Supine Short Arc Quad: AROM, Strengthening, Left, 10 reps, Supine Heel Slides: AROM,  Strengthening, Left, 10 reps, Supine Hip ABduction/ADduction: AROM, Strengthening, Left, 10 reps, Supine Long Arc Quad: AROM, Strengthening, Left, 10 reps, Supine Marching in Standing: AROM, Strengthening, Both, 10 reps, Seated Other Exercises Other Exercises: car transfer, education on reps/sets/frequency of HEP    General Comments        Pertinent Vitals/Pain Pain Assessment Pain Assessment: Faces Faces Pain Scale: Hurts a little bit Pain Location: L hip Pain Descriptors / Indicators: Tender Pain Intervention(s): Limited activity within patient's tolerance, Monitored during session, Repositioned    Home Living                          Prior Function            PT Goals (current goals can now be found in the care plan section) Acute Rehab PT Goals Patient Stated Goal: to go home PT Goal Formulation: With patient/family Time For Goal Achievement: 08/22/22 Potential to Achieve Goals: Good Progress towards PT goals: Progressing toward goals    Frequency    BID      PT Plan Current plan remains appropriate    Co-evaluation              AM-PAC PT "6 Clicks" Mobility   Outcome Measure  Help needed turning from your back to your side while in a flat bed without using bedrails?: None Help needed moving from lying on your back to sitting on the side of a flat bed without using bedrails?: A Little Help needed moving to and from a bed to a chair (including a wheelchair)?: A Little Help needed standing up from a chair using your arms (e.g., wheelchair or bedside chair)?: A Little Help needed to walk in hospital room?: A Little Help needed climbing 3-5 steps with a railing? : A Little 6 Click Score: 16    End of Session Equipment Utilized During Treatment: Gait belt Activity Tolerance: Patient tolerated treatment well Patient left: with call bell/phone within reach;with bed alarm set;with family/visitor present (seated EOB) Nurse Communication:  Mobility status PT Visit Diagnosis: Other abnormalities of gait and mobility (R26.89);Muscle weakness (generalized) (M62.81);Difficulty in walking, not elsewhere classified (R26.2);Unsteadiness on feet (R26.81);Pain Pain - Right/Left: Left Pain - part of body: Hip     Time: [id number] PT Time Calculation (min) (ACUTE ONLY): 10 min  Charges:  $Gait Training: 8-22 mins $Therapeutic Exercise: 8-22 mins $Therapeutic Activity: 8-22 mins                     Prerana Strayer M. Fairly IV, PT, DPT Physical Therapist- [facility]  08/09/2022, 1:30 PM

## 2022-08-09 NOTE — Progress Notes (Addendum)
   Subjective: 1 Day Post-Op Procedure(s) (LRB): TOTAL HIP ARTHROPLASTY ANTERIOR APPROACH (Left) Patient reports pain as mild.   Patient is well, and has had no acute complaints or problems Denies any CP, SOB, ABD pain. We will continue therapy today.  Plan is to go Home after hospital stay.  Objective: Vital signs in last 24 hours: Temp:  [97.2 F (36.2 C)-98.5 F (36.9 C)] 97.8 F (36.6 C) (02/27 0722) Pulse Rate:  [69-102] 69 (02/27 0722) Resp:  [15-22] 16 (02/27 0722) BP: (117-157)/(55-81) 117/62 (02/27 0722) SpO2:  [96 %-100 %] 98 % (02/27 0722)  Intake/Output from previous day: 02/26 0701 - 02/27 0700 In: 3006.2 [P.O.:325; I.V.:2681.2] Out: 1000 [Urine:800; Blood:200] Intake/Output this shift: No intake/output data recorded.  Recent Labs    08/09/22 0246  HGB 10.2*   Recent Labs    08/09/22 0246  WBC 9.1  RBC 3.29*  HCT 30.2*  PLT 173   Recent Labs    08/09/22 0246  NA 135  K 3.5  CL 104  CO2 25  BUN 19  CREATININE 0.93  GLUCOSE 143*  CALCIUM 7.7*   No results for input(s): "LABPT", "INR" in the last 72 hours.  EXAM General - Patient is Alert, Appropriate, and Oriented Extremity - Neurovascular intact Sensation intact distally Intact pulses distally Dorsiflexion/Plantar flexion intact No cellulitis present Compartment soft Dressing - dressing C/D/I and no drainage Motor Function - intact, moving foot and toes well on exam.   Past Medical History:  Diagnosis Date   Arthritis    Asthma    as a child   Basal cell carcinoma    Cellulitis    Chronic pain of left knee    Diabetes mellitus without complication (HCC)    Hypertension    Infected sebaceous cyst    Mixed hyperlipidemia    Myalgia    Spinal stenosis of lumbar region with neurogenic claudication     Assessment/Plan:   1 Day Post-Op Procedure(s) (LRB): TOTAL HIP ARTHROPLASTY ANTERIOR APPROACH (Left) Principal Problem:   Osteoarthritis of left hip  Estimated body mass  index is 24.94 kg/m as calculated from the following:   Height as of this encounter: 5' 5"$  (1.651 m).   Weight as of this encounter: 68 kg. Advance diet Up with therapy Pain well-controlled Labs and vital signs are stable Care management to assist with discharge to home with home health PT today pending successful completion of PT goals  DVT Prophylaxis - Lovenox, TED hose, and SCDS Weight-Bearing as tolerated to left leg   T. Rachelle Hora, PA-C [place] 08/09/2022, 7:38 AM   Patient seen and examined, agree with above plan.  The patient is doing well status post left anterior total hip arthroplasty, no concerns at this time.  Pain is controlled.  Discussed DVT prophylaxis, pain medication use, and safe transition to home.  All questions answered the patient agrees with above plan will go home after clears PT.   Steffanie Rainwater MD

## 2022-08-10 LAB — SURGICAL PATHOLOGY

## 2022-08-29 IMAGING — RF DG LUMBAR SPINE 2-3V
1 series · 4 of 4 positions shown · non-contrast
Comparison: MR lumbar spine, 02/09/2021

CLINICAL DATA: Intraoperative localization, fluoroscopy

EXAM:
DG C-ARM 1-60 MIN; LUMBAR SPINE - 2-3 VIEW
FLUOROSCOPY TIME:  Fluoroscopy Time:  [DATE]
Number of Acquired Spot Images: 4

[Series 1: dg x-ray · 0.20mm/px · 4 of 4 slices shown]
[im 1/4]
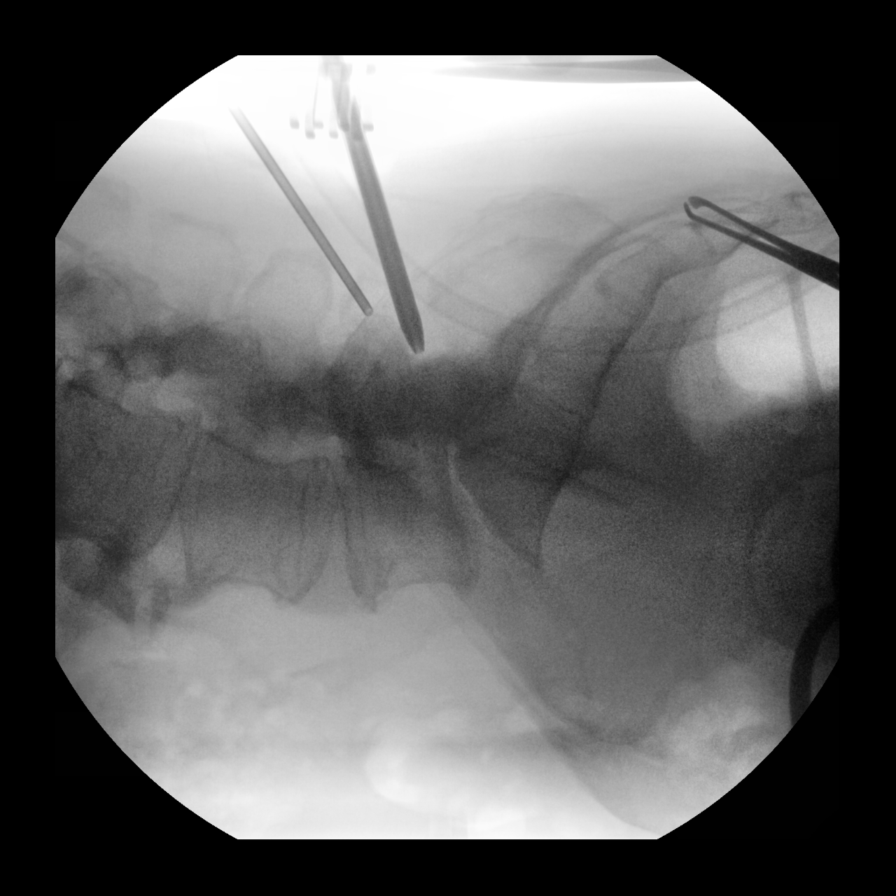
[im 2/4]
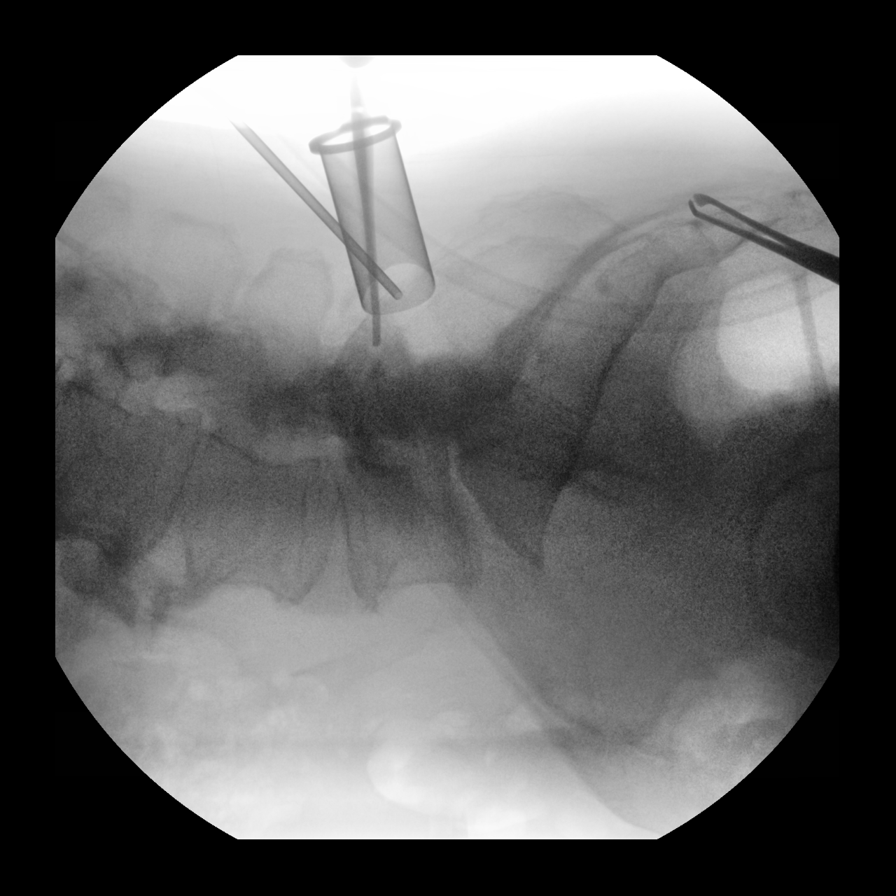
[im 3/4]
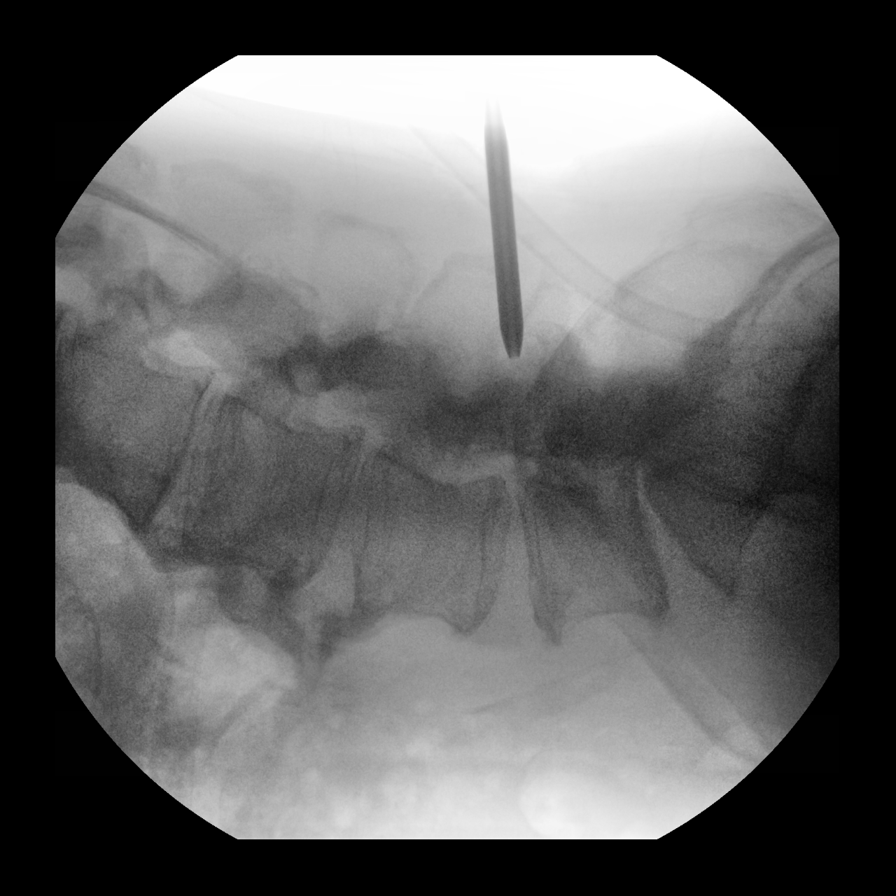
[im 4/4]
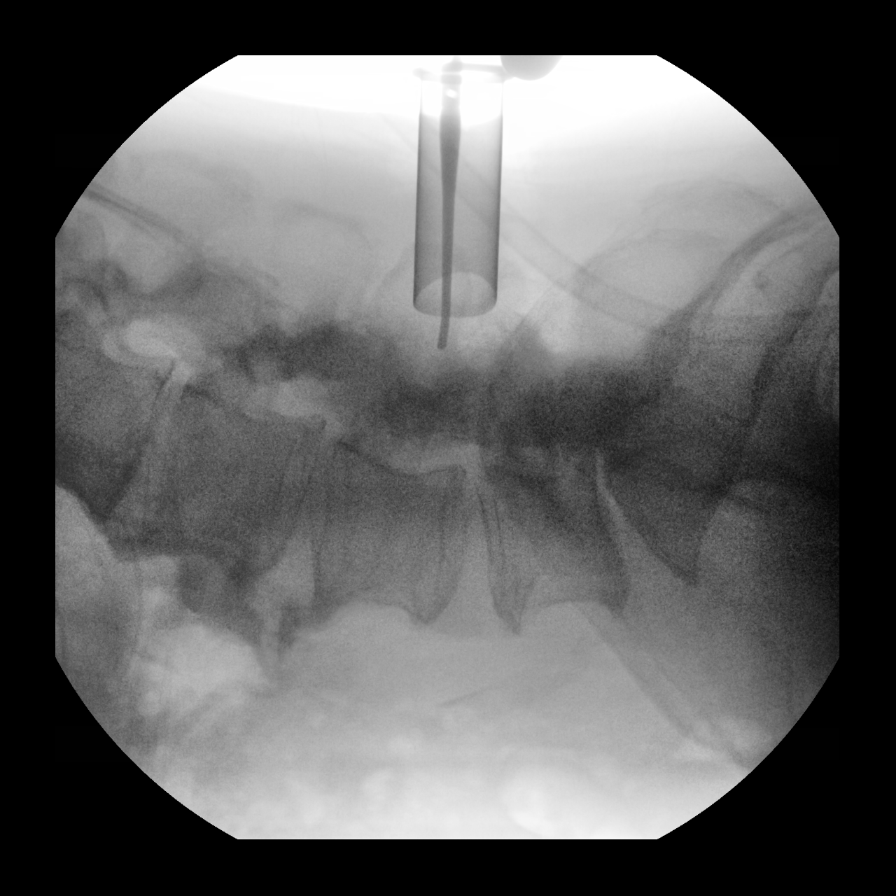

[4 of 4 positions shown; findings below may reference images not displayed]

FINDINGS: Intraoperative lateral fluoroscopic images of the lumbar spine
demonstrate instrument placement posteriorly over L5-S1 and
subsequently over L4-L5.
IMPRESSION: Intraoperative lateral fluoroscopic images of the lumbar spine
demonstrate instrument placement posteriorly over L5-S1 and
subsequently over L4-L5.

## 2022-11-23 ENCOUNTER — Emergency Department: Payer: Medicare Other

## 2022-11-23 ENCOUNTER — Encounter: Payer: Self-pay | Admitting: Emergency Medicine

## 2022-11-23 ENCOUNTER — Inpatient Hospital Stay
Admission: EM | Admit: 2022-11-23 | Discharge: 2022-11-25 | DRG: 872 | Disposition: A | Discharge status: Home / Self Care | Payer: Medicare Other | Attending: Internal Medicine | Admitting: Internal Medicine

## 2022-11-23 ENCOUNTER — Other Ambulatory Visit: Payer: Self-pay

## 2022-11-23 DIAGNOSIS — R7989 Other specified abnormal findings of blood chemistry: Secondary | ICD-10-CM | POA: Diagnosis present

## 2022-11-23 DIAGNOSIS — D696 Thrombocytopenia, unspecified: Secondary | ICD-10-CM | POA: Diagnosis present

## 2022-11-23 DIAGNOSIS — Z85828 Personal history of other malignant neoplasm of skin: Secondary | ICD-10-CM | POA: Diagnosis not present

## 2022-11-23 DIAGNOSIS — R599 Enlarged lymph nodes, unspecified: Secondary | ICD-10-CM | POA: Diagnosis present

## 2022-11-23 DIAGNOSIS — R748 Abnormal levels of other serum enzymes: Secondary | ICD-10-CM | POA: Diagnosis present

## 2022-11-23 DIAGNOSIS — K5730 Diverticulosis of large intestine without perforation or abscess without bleeding: Secondary | ICD-10-CM | POA: Diagnosis present

## 2022-11-23 DIAGNOSIS — E782 Mixed hyperlipidemia: Secondary | ICD-10-CM | POA: Diagnosis present

## 2022-11-23 DIAGNOSIS — Z96643 Presence of artificial hip joint, bilateral: Secondary | ICD-10-CM | POA: Diagnosis present

## 2022-11-23 DIAGNOSIS — A419 Sepsis, unspecified organism: Secondary | ICD-10-CM | POA: Diagnosis present

## 2022-11-23 DIAGNOSIS — Z823 Family history of stroke: Secondary | ICD-10-CM

## 2022-11-23 DIAGNOSIS — D703 Neutropenia due to infection: Secondary | ICD-10-CM | POA: Diagnosis not present

## 2022-11-23 DIAGNOSIS — E785 Hyperlipidemia, unspecified: Secondary | ICD-10-CM

## 2022-11-23 DIAGNOSIS — K219 Gastro-esophageal reflux disease without esophagitis: Secondary | ICD-10-CM | POA: Diagnosis present

## 2022-11-23 DIAGNOSIS — R6520 Severe sepsis without septic shock: Secondary | ICD-10-CM | POA: Diagnosis present

## 2022-11-23 DIAGNOSIS — R55 Syncope and collapse: Secondary | ICD-10-CM | POA: Diagnosis present

## 2022-11-23 DIAGNOSIS — Z7984 Long term (current) use of oral hypoglycemic drugs: Secondary | ICD-10-CM

## 2022-11-23 DIAGNOSIS — R54 Age-related physical debility: Secondary | ICD-10-CM | POA: Diagnosis present

## 2022-11-23 DIAGNOSIS — W57XXXA Bitten or stung by nonvenomous insect and other nonvenomous arthropods, initial encounter: Secondary | ICD-10-CM | POA: Diagnosis present

## 2022-11-23 DIAGNOSIS — M1612 Unilateral primary osteoarthritis, left hip: Secondary | ICD-10-CM | POA: Diagnosis present

## 2022-11-23 DIAGNOSIS — Z888 Allergy status to other drugs, medicaments and biological substances status: Secondary | ICD-10-CM

## 2022-11-23 DIAGNOSIS — I10 Essential (primary) hypertension: Secondary | ICD-10-CM | POA: Diagnosis present

## 2022-11-23 DIAGNOSIS — E119 Type 2 diabetes mellitus without complications: Secondary | ICD-10-CM | POA: Diagnosis present

## 2022-11-23 DIAGNOSIS — E876 Hypokalemia: Secondary | ICD-10-CM | POA: Diagnosis present

## 2022-11-23 DIAGNOSIS — Z8249 Family history of ischemic heart disease and other diseases of the circulatory system: Secondary | ICD-10-CM | POA: Diagnosis not present

## 2022-11-23 DIAGNOSIS — N179 Acute kidney failure, unspecified: Secondary | ICD-10-CM | POA: Diagnosis present

## 2022-11-23 DIAGNOSIS — D72819 Decreased white blood cell count, unspecified: Secondary | ICD-10-CM | POA: Diagnosis present

## 2022-11-23 DIAGNOSIS — R258 Other abnormal involuntary movements: Secondary | ICD-10-CM | POA: Diagnosis present

## 2022-11-23 DIAGNOSIS — Z1152 Encounter for screening for COVID-19: Secondary | ICD-10-CM

## 2022-11-23 DIAGNOSIS — R197 Diarrhea, unspecified: Secondary | ICD-10-CM | POA: Diagnosis present

## 2022-11-23 DIAGNOSIS — B349 Viral infection, unspecified: Secondary | ICD-10-CM | POA: Diagnosis present

## 2022-11-23 DIAGNOSIS — Z79899 Other long term (current) drug therapy: Secondary | ICD-10-CM | POA: Diagnosis not present

## 2022-11-23 DIAGNOSIS — E872 Acidosis, unspecified: Secondary | ICD-10-CM | POA: Diagnosis present

## 2022-11-23 LAB — COMPREHENSIVE METABOLIC PANEL
ALT: 71 U/L — ABNORMAL HIGH (ref 0–44)
AST: 124 U/L — ABNORMAL HIGH (ref 15–41)
Albumin: 4.2 g/dL (ref 3.5–5.0)
Alkaline Phosphatase: 55 U/L (ref 38–126)
Anion gap: 14 (ref 5–15)
BUN: 41 mg/dL — ABNORMAL HIGH (ref 8–23)
CO2: 21 mmol/L — ABNORMAL LOW (ref 22–32)
Calcium: 8.4 mg/dL — ABNORMAL LOW (ref 8.9–10.3)
Chloride: 99 mmol/L (ref 98–111)
Creatinine, Ser: 2.17 mg/dL — ABNORMAL HIGH (ref 0.61–1.24)
GFR, Estimated: 31 mL/min — ABNORMAL LOW (ref >60)
Glucose, Bld: 92 mg/dL (ref 70–99)
Potassium: 3.7 mmol/L (ref 3.5–5.1)
Sodium: 134 mmol/L — ABNORMAL LOW (ref 135–145)
Total Bilirubin: 1 mg/dL (ref 0.3–1.2)
Total Protein: 7.2 g/dL (ref 6.5–8.1)

## 2022-11-23 LAB — CBC WITH DIFFERENTIAL/PLATELET
Abs Immature Granulocytes: 0.01 10*3/uL (ref 0.00–0.07)
Basophils Absolute: 0 10*3/uL (ref 0.0–0.1)
Basophils Relative: 1 %
Eosinophils Absolute: 0 10*3/uL (ref 0.0–0.5)
Eosinophils Relative: 0 %
HCT: 45.9 % (ref 39.0–52.0)
Hemoglobin: 15.3 g/dL (ref 13.0–17.0)
Immature Granulocytes: 0 %
Lymphocytes Relative: 39 %
Lymphs Abs: 0.9 10*3/uL (ref 0.7–4.0)
MCH: 30.1 pg (ref 26.0–34.0)
MCHC: 33.3 g/dL (ref 30.0–36.0)
MCV: 90.2 fL (ref 80.0–100.0)
Monocytes Absolute: 0.2 10*3/uL (ref 0.1–1.0)
Monocytes Relative: 7 %
Neutro Abs: 1.3 10*3/uL — ABNORMAL LOW (ref 1.7–7.7)
Neutrophils Relative %: 53 %
Platelets: 59 10*3/uL — ABNORMAL LOW (ref 150–400)
RBC: 5.09 MIL/uL (ref 4.22–5.81)
RDW: 12.2 % (ref 11.5–15.5)
Smear Review: NORMAL
WBC: 2.4 10*3/uL — ABNORMAL LOW (ref 4.0–10.5)
nRBC: 0 % (ref 0.0–0.2)

## 2022-11-23 LAB — GASTROINTESTINAL PANEL BY PCR, STOOL (REPLACES STOOL CULTURE)

## 2022-11-23 LAB — CBG MONITORING, ED: Glucose-Capillary: 95 mg/dL (ref 70–99)

## 2022-11-23 LAB — LACTIC ACID, PLASMA
Lactic Acid, Venous: 2.6 mmol/L (ref 0.5–1.9)
Lactic Acid, Venous: 2 mmol/L (ref 0.5–1.9)

## 2022-11-23 LAB — URINALYSIS, ROUTINE W REFLEX MICROSCOPIC
Bilirubin Urine: NEGATIVE
Glucose, UA: NEGATIVE mg/dL
Ketones, ur: NEGATIVE mg/dL
Leukocytes,Ua: NEGATIVE
Nitrite: NEGATIVE
Protein, ur: NEGATIVE mg/dL
Specific Gravity, Urine: 1.009 (ref 1.005–1.030)
Squamous Epithelial / HPF: NONE SEEN /HPF (ref 0–5)
pH: 6 (ref 5.0–8.0)

## 2022-11-23 LAB — RESP PANEL BY RT-PCR (RSV, FLU A&B, COVID)  RVPGX2
Influenza A by PCR: NEGATIVE
Influenza B by PCR: NEGATIVE
Resp Syncytial Virus by PCR: NEGATIVE
SARS Coronavirus 2 by RT PCR: NEGATIVE

## 2022-11-23 LAB — LIPASE, BLOOD: Lipase: 128 U/L — ABNORMAL HIGH (ref 11–51)

## 2022-11-23 LAB — MRSA NEXT GEN BY PCR, NASAL: MRSA by PCR Next Gen: NOT DETECTED

## 2022-11-23 LAB — C DIFFICILE QUICK SCREEN W PCR REFLEX
C Diff antigen: NEGATIVE
C Diff interpretation: NOT DETECTED
C Diff toxin: NEGATIVE

## 2022-11-23 LAB — PROTIME-INR
INR: 0.9 (ref 0.8–1.2)
Prothrombin Time: 12.7 seconds (ref 11.4–15.2)

## 2022-11-23 LAB — PROCALCITONIN: Procalcitonin: 0.73 ng/mL

## 2022-11-23 MED ORDER — MELATONIN 5 MG PO TABS
5.0000 mg | ORAL_TABLET | Freq: Every evening | ORAL | Status: DC | PRN
  Administered 2022-11-24: 5 mg via ORAL
  Filled 2022-11-23: qty 1

## 2022-11-23 MED ORDER — SODIUM CHLORIDE 0.9 % IV SOLN
100.0000 mg | Freq: Two times a day (BID) | INTRAVENOUS | Status: DC
  Administered 2022-11-24 – 2022-11-25 (×3): 100 mg via INTRAVENOUS
  Filled 2022-11-23 (×5): qty 100

## 2022-11-23 MED ORDER — METRONIDAZOLE 500 MG/100ML IV SOLN
500.0000 mg | Freq: Two times a day (BID) | INTRAVENOUS | Status: DC
  Administered 2022-11-23 – 2022-11-25 (×4): 500 mg via INTRAVENOUS
  Filled 2022-11-23 (×4): qty 100

## 2022-11-23 MED ORDER — SODIUM CHLORIDE 0.9 % IV SOLN
2.0000 g | INTRAVENOUS | Status: DC
  Administered 2022-11-24: 2 g via INTRAVENOUS
  Filled 2022-11-23 (×2): qty 12.5

## 2022-11-23 MED ORDER — METRONIDAZOLE 500 MG/100ML IV SOLN
500.0000 mg | Freq: Once | INTRAVENOUS | Status: AC
  Administered 2022-11-23: 500 mg via INTRAVENOUS
  Filled 2022-11-23: qty 100

## 2022-11-23 MED ORDER — ACETAMINOPHEN 325 MG PO TABS: 650.0000 mg | ORAL_TABLET | Freq: Four times a day (QID) | ORAL | Status: DC | PRN

## 2022-11-23 MED ORDER — ONDANSETRON HCL 4 MG/2ML IJ SOLN: 4.0000 mg | Freq: Four times a day (QID) | INTRAMUSCULAR | Status: DC | PRN

## 2022-11-23 MED ORDER — SENNOSIDES-DOCUSATE SODIUM 8.6-50 MG PO TABS: 1.0000 | ORAL_TABLET | Freq: Every evening | ORAL | Status: DC | PRN

## 2022-11-23 MED ORDER — HYDRALAZINE HCL 20 MG/ML IJ SOLN: 5.0000 mg | Freq: Four times a day (QID) | INTRAMUSCULAR | Status: DC | PRN

## 2022-11-23 MED ORDER — VANCOMYCIN VARIABLE DOSE PER UNSTABLE RENAL FUNCTION (PHARMACIST DOSING): Status: DC

## 2022-11-23 MED ORDER — SODIUM CHLORIDE 0.9 % IV BOLUS
1000.0000 mL | Freq: Once | INTRAVENOUS | Status: AC
  Administered 2022-11-23: 1000 mL via INTRAVENOUS

## 2022-11-23 MED ORDER — ACETAMINOPHEN 650 MG RE SUPP: 650.0000 mg | Freq: Four times a day (QID) | RECTAL | Status: DC | PRN

## 2022-11-23 MED ORDER — LACTATED RINGERS IV SOLN: INTRAVENOUS | Status: AC

## 2022-11-23 MED ORDER — SODIUM CHLORIDE 0.9 % IV SOLN
2.0000 g | Freq: Once | INTRAVENOUS | Status: AC
  Administered 2022-11-23: 2 g via INTRAVENOUS
  Filled 2022-11-23: qty 12.5

## 2022-11-23 MED ORDER — ONDANSETRON HCL 4 MG PO TABS: 4.0000 mg | ORAL_TABLET | Freq: Four times a day (QID) | ORAL | Status: DC | PRN

## 2022-11-23 MED ORDER — VANCOMYCIN HCL 1500 MG/300ML IV SOLN
1500.0000 mg | Freq: Once | INTRAVENOUS | Status: AC
  Administered 2022-11-23: 1500 mg via INTRAVENOUS
  Filled 2022-11-23: qty 300

## 2022-11-23 MED ORDER — DOXYCYCLINE HYCLATE 100 MG PO TABS
100.0000 mg | ORAL_TABLET | Freq: Once | ORAL | Status: AC
  Administered 2022-11-23: 100 mg via ORAL
  Filled 2022-11-23: qty 1

## 2022-11-23 NOTE — Consult Note (Signed)
PHARMACY -  BRIEF ANTIBIOTIC NOTE   Pharmacy has received consult(s) for cefepime from an ED provider.  The patient's profile has been reviewed for ht/wt/allergies/indication/available labs.    One time order(s) placed for  cefepime 2 gram  Further antibiotics/pharmacy consults should be ordered by admitting physician if indicated.                       Thank you, Sharen Hones 11/23/2022  1:13 PM

## 2022-11-23 NOTE — Assessment & Plan Note (Signed)
Patient not taking a home PPI

## 2022-11-23 NOTE — ED Provider Notes (Signed)
[facility] Provider Note    Event Date/Time   First MD Initiated Contact with Patient 11/23/22 1203     (approximate)   History   Diarrhea and Generalized Body Aches   HPI  Vincent Small is a 77 y.o. male presents to the ER for evaluation of fever poor p.o. intake diarrhea and muscle cramping.  Patient sent over from the walk-in clinic.  He denies any pain.  Has been having multiple episodes of watery diarrhea for few days.,  Brought to the ER he had near syncopal episode with generalized shaking spell is brief in nature.  Found to be febrile as well as hypotensive.     Physical Exam   Triage Vital Signs: ED Triage Vitals  Enc Vitals Group     BP 11/23/22 1148 (!) 84/58     Pulse Rate 11/23/22 1148 87     Resp 11/23/22 1148 20     Temp 11/23/22 1148 (!) 100.8 F (38.2 C)     Temp Source 11/23/22 1148 Oral     SpO2 11/23/22 1148 100 %     Weight 11/23/22 1147 150 lb (68 kg)     Height 11/23/22 1147 5\' 7"  (1.702 m)     Head Circumference --      Peak Flow --      Pain Score 11/23/22 1148 0     Pain Loc --      Pain Edu? --      Excl. in GC? --     Most recent vital signs: Vitals:   11/23/22 1330 11/23/22 1400  BP: 115/61   Pulse: 89 85  Resp: 17 19  Temp:    SpO2: 98% 100%     Constitutional: Alert  Eyes: Conjunctivae are normal.  Head: Atraumatic. Nose: No congestion/rhinnorhea. Mouth/Throat: Mucous membranes are moist.   Neck: Painless ROM.  Cardiovascular:   Good peripheral circulation. Respiratory: Normal respiratory effort.  No retractions.  Gastrointestinal: Soft and nontender.  Musculoskeletal:  no deformity Neurologic:  MAE spontaneously. No gross focal neurologic deficits are appreciated.  Skin:  Skin is warm, dry and intact. No rash noted. Psychiatric: Mood and affect are normal. Speech and behavior are normal.    ED Results / Procedures / Treatments   Labs (all labs ordered are listed, but only abnormal results  are displayed) Labs Reviewed  COMPREHENSIVE METABOLIC PANEL - Abnormal; Notable for the following components:      Result Value   Sodium 134 (*)    CO2 21 (*)    BUN 41 (*)    Creatinine, Ser 2.17 (*)    Calcium 8.4 (*)    AST 124 (*)    ALT 71 (*)    GFR, Estimated 31 (*)    All other components within normal limits  LACTIC ACID, PLASMA - Abnormal; Notable for the following components:   Lactic Acid, Venous 2.6 (*)    All other components within normal limits  LACTIC ACID, PLASMA - Abnormal; Notable for the following components:   Lactic Acid, Venous 2.0 (*)    All other components within normal limits  CBC WITH DIFFERENTIAL/PLATELET - Abnormal; Notable for the following components:   WBC 2.4 (*)    Platelets 59 (*)    Neutro Abs 1.3 (*)    All other components within normal limits  LIPASE, BLOOD - Abnormal; Notable for the following components:   Lipase 128 (*)    All other components within normal limits  RESP PANEL BY RT-PCR (RSV, FLU A&B, COVID)  [id number]  CULTURE, BLOOD (ROUTINE X 2)  CULTURE, BLOOD (ROUTINE X 2)  PROTIME-INR  URINALYSIS, ROUTINE W REFLEX MICROSCOPIC  CBG MONITORING, ED     EKG  ED ECG REPORT I, Willy Eddy, the attending physician, personally viewed and interpreted this ECG.   Date: 11/23/2022  EKG Time: 11:57  Rate: 65  Rhythm: sinus  Axis: normal  Intervals: normal  ST&T Change: no stemi    RADIOLOGY Please see ED Course for my review and interpretation.  I personally reviewed all radiographic images ordered to evaluate for the above acute complaints and reviewed radiology reports and findings.  These findings were personally discussed with the patient.  Please see medical record for radiology report.    PROCEDURES:  Critical Care performed: Yes, see critical care procedure note(s)  .Critical Care  Performed by: Willy Eddy, MD Authorized by: Willy Eddy, MD   Critical care provider statement:    Critical care  time (minutes):  40   Critical care was necessary to treat or prevent imminent or life-threatening deterioration of the following conditions:  Sepsis   Critical care was time spent personally by me on the following activities:  Ordering and performing treatments and interventions, ordering and review of laboratory studies, ordering and review of radiographic studies, pulse oximetry, re-evaluation of patient's condition, review of old charts, obtaining history from patient or surrogate, examination of patient, evaluation of patient's response to treatment, discussions with primary provider, discussions with consultants and development of treatment plan with patient or surrogate    MEDICATIONS ORDERED IN ED: Medications  lactated ringers infusion ( Intravenous New Bag/Given 11/23/22 1449)  sodium chloride 0.9 % bolus 1,000 mL (0 mLs Intravenous Stopped 11/23/22 1312)  ceFEPIme (MAXIPIME) 2 g in sodium chloride 0.9 % 100 mL IVPB (0 g Intravenous Stopped 11/23/22 1347)  metroNIDAZOLE (FLAGYL) IVPB 500 mg (500 mg Intravenous New Bag/Given 11/23/22 1354)  sodium chloride 0.9 % bolus 1,000 mL (0 mLs Intravenous Stopped 11/23/22 1448)  doxycycline (VIBRA-TABS) tablet 100 mg (100 mg Oral Given 11/23/22 1449)     IMPRESSION / MDM / ASSESSMENT AND PLAN / ED COURSE  I reviewed the triage vital signs and the nursing notes.                              Differential diagnosis includes, but is not limited to, sepsis, bacteremia, UTI, cholelithiasis, cholecystitis, diverticulitis, pyelonephritis, tickborne illness, COVID, pneumonia, flu  Patient presenting to the ER for evaluation of symptoms as described above.  Based on symptoms, risk factors and considered above differential, this presenting complaint could reflect a potentially life-threatening illness therefore the patient will be placed on continuous pulse oximetry and telemetry for monitoring.  Laboratory evaluation will be sent to evaluate for the above  complaints.      Clinical Course as of 11/23/22 1507  Wed Nov 23, 2022  1232 Chest x-ray my review and interpretation without evidence of consolidation or infiltrate. [PR]  1304 Patient with lactic acidosis leukopenia, given his hypertension fever covered empirically for sepsis.  Will order CT imaging of the abdomen.  Considered possible rickettsial infection given recent tick bite.  Clinically improved after IV fluids. [PR]  1433 I have ordered right upper quadrant ultrasound to further evaluate for possible gallbladder source of infection.  I will cover with doxycycline given reported exposure to tick. [PR]    Clinical Course User Index [PR]  Willy Eddy, MD     FINAL CLINICAL IMPRESSION(S) / ED DIAGNOSES   Final diagnoses:  Sepsis, due to unspecified organism, unspecified whether acute organ dysfunction present [facility])     Rx / DC Orders   ED Discharge Orders     None        Note:  This document was prepared using Dragon voice recognition software and may include unintentional dictation errors.    Willy Eddy, MD 11/23/22 [phone]

## 2022-11-23 NOTE — H&P (Signed)
History and Physical   Vincent Small WJX:[id number] DOB: 1945-10-16 DOA: 11/23/2022  PCP: Marisue Ivan, MD  Patient coming from: [facility]  I have personally briefly reviewed patient's old medical records in [facility] EMR.  Chief Concern: Body aches, diarrhea  HPI: Mr. Vincent Small is a is a 77 year old male with history of hypertension, non-insulin-dependent diabetes mellitus, hyperlipidemia, GERD, who presents to the emergency department for chief concerns of diarrhea, generalized bodyaches.  Vitals in the ED showed Tmax of 100.8, respiration rate of 20, heart rate 74, blood pressure 113/67, SpO2 of 99% on room air.  Serum sodium is 134, potassium 3.7, chloride 99, bicarb 21, BUN of 41, serum creatinine of 2.17, EGFR 31, nonfasting glucose 92, WBC 2.4, hemoglobin 15.3, platelets of 59.  CT abd/pel wo contrast: Colonic diverticulosis, no bowel obstruction, free air.  Several abdominal lymph nodes along the left pelvic sidewall and left inguinal region.  Some areas are obscured by the extensive artifact of bilateral hip arthropathy plasties.  Distended gallbladder, question wall thickening although evaluation limited by motion.  Subcutaneous nodule along the left side of the upper abdomen, lower chest.  Broad differentials including benign and aggressive process.  ED treatment: Doxycycline 100 mg p.o. one-time dose, cefepime 2 g IV, metronidazole 500 mg IV, LR 150 mL/h, sodium chloride 1 L bolus x 2 were ordered. ------------------------------ At bedside, he is able to tell me his name, age, current location, current year.  He has been having diarrhea, 4-5, and he started taking peptobismol.once- twice per day. He endorses poor PO intake for the same amount of time.  He denies known sick contacts.  He deneis nausea, vomiting, chest pain, shortness of breath, fever. He endorses sweating, chills, and subjective feelings of fever but they don't have a thermameter to check.  Spouse also  notes that they were walking the other day and patient had a tick that spouse removed.  There was no reported rash.  Social history: He lives at home with his spouse. He denies tobacco, etoh, recreational drug use. He drinks a glass of wine once per year. 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  ROS: Constitutional: no weight change, + fever, + chills ENT/Mouth: no sore throat, no rhinorrhea Eyes: no eye pain, no vision changes Cardiovascular: no chest pain, no dyspnea,  no edema, no palpitations Respiratory: no cough, no sputum, no wheezing Gastrointestinal: no nausea, no vomiting, + diarrhea, no constipation Genitourinary: no urinary incontinence, no dysuria, no hematuria Musculoskeletal: no arthralgias, + myalgias Skin: no skin lesions, no pruritus, Neuro: + weakness, no loss of consciousness, no syncope Psych: no anxiety, no depression, + decrease appetite Heme/Lymph: no bruising, no bleeding  ED Course: Discussed with emergency medicine provider, patient requiring hospitalization for chief concerns of possible sepsis.  Assessment/Plan  Principal Problem:   Severe sepsis (HCC) Active Problems:   Osteoarthritis of left hip   Essential hypertension   Hyperlipidemia   Diabetes mellitus type 2, noninsulin dependent (HCC)   GERD (gastroesophageal reflux disease)   Diarrhea   AKI (acute kidney injury) (HCC)   Thrombocytopenia (HCC)   Lymph nodes enlarged   Leukopenia   Assessment and Plan:  * Severe sepsis (HCC) Etiology workup in progress Patient meets severe sepsis criteria with fever, elevated respiration rate, leukopenia, with elevated lactic acid and organ involvement is renal Blood cultures x 2 are in process Check procalcitonin on admission and found to be elevated, we will initiate with doxycycline 100 mg IV BID for  atypical coverage and also patient recently had a tick bite that was removed by spouse GI panel and C. difficile quick screen ordered on admission  and pending collection Recheck CBC in the a.m. Admit to telemetry medical, inpatient  Leukopenia Suspect secondary to sepsis, recheck CBC in a.m.  Lymph nodes enlarged With elevated liver enzymes and thrombocytopenia and leukopenia, aggressive process including carcinoma cannot be excluded at this time Further CT imaging with contrast not ordered on admission due to acute kidney injury AM team to consider additional imaging should renal function improve  Thrombocytopenia (HCC) Repeat CBC in a.m.  AKI (acute kidney injury) (HCC) I suspect this is multifactorial including prerenal and intrarenal in setting of severe sepsis Avoid nephrotoxic agents Status post sodium chloride 2 L bolus per EDP Continue with LR infusion at 150 mL/h, 20 hours ordered Recheck BMP in the a.m.  Diarrhea In setting of possible sepsis, infectious diarrhea cannot be excluded at this time C. difficile screening and GI panel have been ordered on admission and discussed with patient and family at bedside to let nursing know so that sample can be collected No antidiarrheal medication will be given until stool sampling have resulted  GERD (gastroesophageal reflux disease) Patient not taking a home PPI  Diabetes mellitus type 2, noninsulin dependent (HCC) Home p.o. antiglycemic agents not resumed on admission Insulin SSI with agents coverage ordered Goal inpatient blood glucose levels 140-180  Hyperlipidemia Home ezetimibe 10 mg daily and red yeast rice were not resumed on admission  Essential hypertension Hydrochlorothiazide 12.5 mg p.o. daily and losartan 100 mg daily were not resumed on admission due to acute kidney injury AM team to resume when the benefits outweigh the risks Hydralazine 5 mg IV every 6 hours as needed for SBP greater 175, 4 days ordered  Chart reviewed.   DVT prophylaxis: TED hose; Code Status: Full code Diet: Heart healthy/carb modified Family Communication: Updated spouse with  patient's permission Disposition Plan: Pending clinical course Consults called: None at this time Admission status: Telemetry medical, inpatient  Past Medical History:  Diagnosis Date   Arthritis    Asthma    as a child   Basal cell carcinoma    Cellulitis    Chronic pain of left knee    Diabetes mellitus without complication (HCC)    Hypertension    Infected sebaceous cyst    Mixed hyperlipidemia    Myalgia    Spinal stenosis of lumbar region with neurogenic claudication    Past Surgical History:  Procedure Laterality Date   COLONOSCOPY     HERNIA REPAIR Right    inguinal x 2   LUMBAR LAMINECTOMY/DECOMPRESSION MICRODISCECTOMY N/A 04/14/2021   Procedure: L4-S1 DECOMPRESSION;  Surgeon: Venetia Night, MD;  Location: [facility] ORS;  Service: Neurosurgery;  Laterality: N/A;   PILONIDAL CYST EXCISION     TONSILLECTOMY     TOTAL HIP ARTHROPLASTY Right    2010   TOTAL HIP ARTHROPLASTY Left 08/08/2022   Procedure: TOTAL HIP ARTHROPLASTY ANTERIOR APPROACH;  Surgeon: Reinaldo Berber, MD;  Location: [facility] ORS;  Service: Orthopedics;  Laterality: Left;   Social History:  reports that he has never smoked. He has never used smokeless tobacco. He reports that he does not drink alcohol and does not use drugs.  Allergies  Allergen Reactions   Statins     Muscle Pain   Family History  Problem Relation Age of Onset   Stroke Mother    Heart failure Father    Family history: Family history  reviewed and not pertinent  Prior to Admission medications   Medication Sig Start Date End Date Taking? Authorizing Provider  acetaminophen (TYLENOL) 500 MG tablet Take 2 tablets (1,000 mg total) by mouth every 8 (eight) hours. 08/09/22   Evon Slack, PA-C  docusate sodium (COLACE) 100 MG capsule Take 1 capsule (100 mg total) by mouth 2 (two) times daily. 08/09/22   Evon Slack, PA-C  enoxaparin (LOVENOX) 40 MG/0.4ML injection Inject 0.4 mLs (40 mg total) into the skin daily for 14 days.  08/09/22 08/23/22  Evon Slack, PA-C  ezetimibe (ZETIA) 10 MG tablet Take 10 mg by mouth daily.    [provider]  glimepiride (AMARYL) 2 MG tablet Take 2 mg by mouth every morning. 11/30/20   [provider]  hydrochlorothiazide (MICROZIDE) 12.5 MG capsule Take 12.5 mg by mouth daily.    [provider]  losartan (COZAAR) 100 MG tablet Take 100 mg by mouth daily.    [provider]  ondansetron (ZOFRAN) 4 MG tablet Take 1 tablet (4 mg total) by mouth every 6 (six) hours as needed for nausea. 08/09/22   Evon Slack, PA-C  OVER THE COUNTER MEDICATION Take 1 tablet by mouth as needed. bio-fisetin otc supplement    [provider]  Red Yeast Rice 600 MG CAPS Take 1,200 mg by mouth 2 (two) times a week.    [provider]  timolol (BETIMOL) 0.5 % ophthalmic solution Place 1 drop into both eyes daily.    [provider]  traMADol (ULTRAM) 50 MG tablet Take 1 tablet (50 mg total) by mouth every 6 (six) hours as needed for moderate pain. 08/09/22   Evon Slack, PA-C   Physical Exam: Vitals:   11/23/22 1400 11/23/22 1530 11/23/22 1600 11/23/22 1630  BP:  127/64 105/69 114/64  Pulse: 85 82 78 74  Resp: 19  20   Temp:      TempSrc:      SpO2: 100% 100% 100% 100%  Weight:      Height:       Constitutional: appears age-appropriate, frail, calm Eyes: PERRL, lids and conjunctivae normal ENMT: Mucous membranes are moist. Posterior pharynx clear of any exudate or lesions. Age-appropriate dentition.  Bilateral moderate to severe hearing loss Neck: normal, supple, no masses, no thyromegaly Respiratory: clear to auscultation bilaterally, no wheezing, no crackles. Normal respiratory effort. No accessory muscle use.  Cardiovascular: Regular rate and rhythm, no murmurs / rubs / gallops. No extremity edema. 2+ pedal pulses. No carotid bruits.  Abdomen: no tenderness, no masses palpated, no hepatosplenomegaly. Bowel sounds positive.   Musculoskeletal: no clubbing / cyanosis. No joint deformity upper and lower extremities. Good ROM, no contractures, no atrophy. Normal muscle tone.  Skin: no rashes, lesions, ulcers. No induration Neurologic: Sensation intact. Strength 5/5 in all 4.  Psychiatric: Normal judgment and insight. Alert and oriented x 3. Normal mood.   EKG: independently reviewed, showing sinus rhythm with rate of 64, QTc 400  Chest x-ray on Admission: I personally reviewed and I agree with radiologist reading as below.  US ABDOMEN LIMITED RUQ (LIVER/GB)  Result Date: 11/23/2022 CLINICAL DATA:  Sepsis EXAM: ULTRASOUND ABDOMEN LIMITED RIGHT UPPER QUADRANT COMPARISON:  CT 11/23/2018 FINDINGS: Gallbladder: Dilated gallbladder. No wall thickening or adjacent fluid. No stones. Slight sludge. Common bile duct: Diameter: 2 mm Liver: No focal lesion identified. Within normal limits in parenchymal echogenicity. Portal vein is patent on color Doppler imaging with normal direction of blood flow  towards the liver. Other: None. IMPRESSION: Dilated gallbladder. Mild sludge. No shadowing stones. No biliary ductal dilatation Electronically Signed   By: Karen Kays M.D.   On: 11/23/2022 15:29   CT ABDOMEN PELVIS WO CONTRAST  Result Date: 11/23/2022 CLINICAL DATA:  Sepsis.  Fever, diarrhea and muscle cramping EXAM: CT ABDOMEN AND PELVIS WITHOUT CONTRAST TECHNIQUE: Multidetector CT imaging of the abdomen and pelvis was performed following the standard protocol without IV contrast. RADIATION DOSE REDUCTION: This exam was performed according to the departmental dose-optimization program which includes automated exposure control, adjustment of the mA and/or kV according to patient size and/or use of iterative reconstruction technique. COMPARISON:  None Available. FINDINGS: Lower chest: Slight linear opacity left lung base likely scar or atelectasis. No pleural effusion. Coronary artery calcifications are seen. Prominent epicardial fat.  Hepatobiliary: No IV contrast. Posterior medial right hepatic lobe presumed calcified granuloma. Gallbladder is distended. There is also some possible wall thickening and stranding. Pancreas: Moderate atrophy of the pancreas.  No obvious mass. Spleen: Normal in size without focal abnormality. Small splenule at the hilum. Adrenals/Urinary Tract: Adrenal glands are preserved. No abnormal calcifications seen within either kidney nor along the course of either ureter. Portions of the bladder and distal ureter obscured by the extensive streak artifact from the bilateral hip arthroplasties. Bladder is grossly nondilated. Stomach/Bowel: No oral contrast. Redundant course to the sigmoid colon. Large bowel has a normal course and caliber overall. Scattered colonic diverticula greatest in the transverse and descending and sigmoid colon. Normal appendix extends inferior to the cecum in the right hemipelvis. Stomach is contracted. Small bowel is nondilated. Vascular/Lymphatic: Scattered vascular calcifications. Normal caliber aorta and IVC. There are abnormal lymph nodes along the left iliac chain. Example series 2, image 66 measures 20 by 11 mm. Several other lesions are seen. There is adjacent stranding as well. Some of these areas are obscured by the artifact from the hip arthroplasties. Additional left inguinal node on series 2, image 85 measures 18 by 13 mm. Reproductive: Obscured. Other: No free air or free fluid. Nonspecific retroperitoneal perinephric stranding. Diffuse motion. Musculoskeletal: Bilateral hip arthroplasties with degenerative changes. Moderate degenerative changes of the spine with curvature. Subcutaneous nodule along the left side of the level of the diaphragm on image 13 measuring 11 mm. This has a differential including a soft tissue true mass lesion versus sebaceous cyst. Please correlate for any known history and dedicated evaluation when appropriate IMPRESSION: Colonic diverticulosis.  No bowel  obstruction, free air. Several abnormal lymph nodes along the left pelvic sidewall and left inguinal region. Some of the areas are obscured by the extensive artifact from the bilateral hip arthroplasties. An aggressive process is in the differential recommend further evaluation. Distended gallbladder. Question some wall thickening although evaluation limited by motion. If there is concern of gallbladder pathology, an ultrasound is recommended as the next step in the workup. Subcutaneous nodule along the left side of the upper abdomen, lower chest. This a broad differential including benign and aggressive process. Please correlate with clinical findings. Additional evaluation otherwise when clinically appropriate Electronically Signed   By: Karen Kays M.D.   On: 11/23/2022 14:09   DG Chest Port 1 View  Result Date: 11/23/2022 CLINICAL DATA:  Sepsis. EXAM: PORTABLE CHEST 1 VIEW COMPARISON:  05/13/2009. FINDINGS: Low lung volumes accentuate the pulmonary vasculature and cardiomediastinal silhouette. No consolidation or pulmonary edema. No pleural effusion or pneumothorax. No acute bony findings. IMPRESSION: Low lung volumes without evidence of acute cardiopulmonary disease. Electronically Signed  By: Orvan Falconer M.D.   On: 11/23/2022 12:52    Labs on Admission: I have personally reviewed following labs  CBC: Recent Labs  Lab 11/23/22 1203  WBC 2.4*  NEUTROABS 1.3*  HGB 15.3  HCT 45.9  MCV 90.2  PLT 59*   Basic Metabolic Panel: Recent Labs  Lab 11/23/22 1203  NA 134*  K 3.7  CL 99  CO2 21*  GLUCOSE 92  BUN 41*  CREATININE 2.17*  CALCIUM 8.4*   GFR: Estimated Creatinine Clearance: 26.7 mL/min (A) (by C-G formula based on SCr of 2.17 mg/dL (H)).  Liver Function Tests: Recent Labs  Lab 11/23/22 1203  AST 124*  ALT 71*  ALKPHOS 55  BILITOT 1.0  PROT 7.2  ALBUMIN 4.2   Recent Labs  Lab 11/23/22 1203  LIPASE 128*   Coagulation Profile: Recent Labs  Lab 11/23/22 1203   INR 0.9   CBG: Recent Labs  Lab 11/23/22 1157  GLUCAP 95   Urine analysis:    Component Value Date/Time   COLORURINE YELLOW (A) 11/23/2022 1203   APPEARANCEUR CLEAR (A) 11/23/2022 1203   LABSPEC 1.009 11/23/2022 1203   PHURINE 6.0 11/23/2022 1203   GLUCOSEU NEGATIVE 11/23/2022 1203   HGBUR SMALL (A) 11/23/2022 1203   BILIRUBINUR NEGATIVE 11/23/2022 1203   KETONESUR NEGATIVE 11/23/2022 1203   PROTEINUR NEGATIVE 11/23/2022 1203   NITRITE NEGATIVE 11/23/2022 1203   LEUKOCYTESUR NEGATIVE 11/23/2022 1203   This document was prepared using Dragon Voice Recognition software and may include unintentional dictation errors.  Dr. Sedalia Muta [facility]  If 7PM-7AM, please contact overnight-coverage provider If 7AM-7PM, please contact day attending provider [email or web address]  11/23/2022, 5:01 PM

## 2022-11-23 NOTE — Assessment & Plan Note (Signed)
Home p.o. antiglycemic agents not resumed on admission Insulin SSI with agents coverage ordered Goal inpatient blood glucose levels 140-180

## 2022-11-23 NOTE — Assessment & Plan Note (Signed)
Home ezetimibe 10 mg daily and red yeast rice were not resumed on admission

## 2022-11-23 NOTE — Hospital Course (Addendum)
Mr. Vincent Small is a is a 77 year old male with history of hypertension, non-insulin-dependent diabetes mellitus, hyperlipidemia, GERD, who presents to the emergency department for chief concerns of diarrhea, generalized bodyaches.  Wife recently removed a tick from him.  Vitals in the ED showed Tmax of 100.8, respiration rate of 20, heart rate 74, blood pressure 113/67, SpO2 of 99% on room air.  Serum sodium is 134, potassium 3.7, chloride 99, bicarb 21, BUN of 41, serum creatinine of 2.17, EGFR 31, nonfasting glucose 92, WBC 2.4, hemoglobin 15.3, platelets of 59.  CT abd/pel wo contrast: Colonic diverticulosis, no bowel obstruction, free air.  Several abdominal lymph nodes along the left pelvic sidewall and left inguinal region.  Some areas are obscured by the extensive artifact of bilateral hip arthropathy plasties.  Distended gallbladder, question wall thickening although evaluation limited by motion.  Subcutaneous nodule along the left side of the upper abdomen, lower chest.  Broad differentials including benign and aggressive process.  ED treatment: Doxycycline 100 mg p.o. one-time dose, cefepime 2 g IV, metronidazole 500 mg IV, LR 150 mL/h, sodium chloride 1 L bolus x 2 were ordered.  6/13: Afebrile this morning, maximum temperature recorded 100.8 over the past 24-hour.  Worsening leukopenia and thrombocytopenia.  C. difficile PCR and GI pathogen panel negative.  MRSA PCR negative.  Procalcitonin 0.73.  COVID-negative.  Lipase elevated at 128, UA not consistent with UTI.  Renal functions improving with creatinine at 1.4, baseline less than 1.  Hypokalemia with potassium of 2.9 which is being repleted, magnesium at 2. No rash noted by patient.  6/14: Remained afebrile and clinically improving.  Stable leukopenia with some improvement in thrombocytopenia.  Diarrhea resolved.  AKI resolved.  Likely having tickborne illness.  Patient is being discharged on 5 days of doxycycline.  Blood pressure  started improving so he will resume his home antihypertensives.  Patient will need to follow-up with primary care provider within next few days and have his blood levels repeated.  He will continue on current medications and need to have a close follow-up with his providers.

## 2022-11-23 NOTE — Consult Note (Signed)
Pharmacy Antibiotic Note  Vincent Small is a 77 y.o. male admitted on 11/23/2022 with  infection of unknown origin . Patient sent from the walk-in clinic for evaluation of fever poor p.o. intake diarrhea and muscle cramping. Patient has no documented allergies to antimicrobials. Pharmacy has been consulted for vancomycin and cefepime dosing.  Plan: Day 1 of antibiotics Give vancomycin 1500 mg IV x1. Check random vanc level tomorrow given AKI (SCr 2.17, baseline 0.9 on 08/09/2022)  Start cefepime 2 g IV Q24H Patient is also on metronidazole 500 mg IV Q12H Continue to monitor renal function and follow culture results  Height: 5\' 7"  (170.2 cm) Weight: 68 kg (150 lb) IBW/kg (Calculated) : 66.1  Temp (24hrs), Avg:100.8 F (38.2 C), Min:100.8 F (38.2 C), Max:100.8 F (38.2 C)  Recent Labs  Lab 11/23/22 1203 11/23/22 1356  WBC 2.4*  --   CREATININE 2.17*  --   LATICACIDVEN 2.6* 2.0*    Estimated Creatinine Clearance: 26.7 mL/min (A) (by C-G formula based on SCr of 2.17 mg/dL (H)).    Allergies  Allergen Reactions   Statins     Muscle Pain    Antimicrobials this admission: 6/12 Doxycyline x1 6/12 Vancomycin >>  6/12 Cefepime >>  6/12 Metronidazole >>  Dose adjustments this admission: N/A  Microbiology results: 6/12 BCx: IP 6/12 UCx: ordered 6/12 MRSA PCR: ordered  Thank you for allowing pharmacy to be a part of this patient's care.  Celene Squibb, PharmD PGY1 Pharmacy Resident 11/23/2022 3:43 PM

## 2022-11-23 NOTE — Assessment & Plan Note (Signed)
Suspect secondary to sepsis, recheck CBC in a.m.

## 2022-11-23 NOTE — Assessment & Plan Note (Addendum)
Likely secondary to sepsis.  Small decreased today Repeat CBC in a.m.

## 2022-11-23 NOTE — ED Triage Notes (Addendum)
Pt via POV c/o diarrhea, body aches, poor PO intake x a week and a half. Estimates 3 episodes of diarrhea in the past 24 hours. No meds PTA. During triage, pt noted to have temp 100.8 and BP 80s/50s. He then had an episode of trembling almost like a seizure and nearly lost consciousness. This happened twice with two nurses present. Notified MD and pt was taken directly to a room.

## 2022-11-23 NOTE — Assessment & Plan Note (Addendum)
Etiology workup in progress Patient meets severe sepsis criteria with fever, elevated respiration rate, leukopenia, with elevated lactic acid and organ involvement is renal Concern of tickborne illness.  Seems improving, no rash.  PCT elevated Preliminary blood cultures negative.  MRSA PCR negative so vancomycin was discontinued -Continue with cefepime and doxycycline -Monitor CBC

## 2022-11-23 NOTE — ED Notes (Signed)
First Nurse Note: Pt to ED via [facility] for decreased PO intake, fever, diarrhea, and muscle cramping. Pt is in NAD.

## 2022-11-23 NOTE — Assessment & Plan Note (Addendum)
Likely secondary to sepsis as improving with IV fluid, current creatinine at 1.4, baseline seems to be less than 1. -Monitor renal function -Avoid nephrotoxins

## 2022-11-23 NOTE — ED Notes (Signed)
US at bedside

## 2022-11-23 NOTE — Assessment & Plan Note (Addendum)
Likely secondary to acute tickborne/viral illness. Continue with current course of antibiotics, patient will need further investigation if fail to resolve in the next couple of weeks

## 2022-11-23 NOTE — Assessment & Plan Note (Addendum)
Likely secondary to sepsis.  GI pathogen panel and C. difficile PCR was negative. Diarrhea seems improving -Supportive care

## 2022-11-23 NOTE — Assessment & Plan Note (Addendum)
Hydrochlorothiazide 12.5 mg p.o. daily and losartan 100 mg daily were not resumed on admission due to acute kidney injury Blood pressure within goal-we will continue to hold antihypertensives at this time Hydralazine 5 mg IV every 6 hours as needed for SBP greater 175, 4 days ordered

## 2022-11-23 NOTE — Consult Note (Signed)
CODE SEPSIS - PHARMACY COMMUNICATION  **Broad Spectrum Antibiotics should be administered within 1 hour of Sepsis diagnosis**  Time Code Sepsis Called/Page Received: 1519  Antibiotics Ordered: cefepime, doxycycline, metronidazole, vancomycin  Time of 1st antibiotic administration: 1315  Additional action taken by pharmacy: N/A  Celene Squibb, PharmD PGY1 Pharmacy Resident 11/23/2022 3:29 PM

## 2022-11-24 DIAGNOSIS — D696 Thrombocytopenia, unspecified: Secondary | ICD-10-CM | POA: Diagnosis not present

## 2022-11-24 DIAGNOSIS — R599 Enlarged lymph nodes, unspecified: Secondary | ICD-10-CM | POA: Diagnosis not present

## 2022-11-24 DIAGNOSIS — N179 Acute kidney failure, unspecified: Secondary | ICD-10-CM

## 2022-11-24 DIAGNOSIS — R197 Diarrhea, unspecified: Secondary | ICD-10-CM

## 2022-11-24 DIAGNOSIS — E119 Type 2 diabetes mellitus without complications: Secondary | ICD-10-CM

## 2022-11-24 DIAGNOSIS — E785 Hyperlipidemia, unspecified: Secondary | ICD-10-CM

## 2022-11-24 DIAGNOSIS — I10 Essential (primary) hypertension: Secondary | ICD-10-CM

## 2022-11-24 DIAGNOSIS — D703 Neutropenia due to infection: Secondary | ICD-10-CM | POA: Diagnosis not present

## 2022-11-24 DIAGNOSIS — A419 Sepsis, unspecified organism: Secondary | ICD-10-CM | POA: Diagnosis not present

## 2022-11-24 LAB — CBC WITH DIFFERENTIAL/PLATELET
Abs Immature Granulocytes: 0.01 10*3/uL (ref 0.00–0.07)
Basophils Absolute: 0 10*3/uL (ref 0.0–0.1)
Basophils Relative: 1 %
Eosinophils Absolute: 0 10*3/uL (ref 0.0–0.5)
Eosinophils Relative: 0 %
HCT: 37.7 % — ABNORMAL LOW (ref 39.0–52.0)
Hemoglobin: 12.5 g/dL — ABNORMAL LOW (ref 13.0–17.0)
Immature Granulocytes: 1 %
Lymphocytes Relative: 44 %
Lymphs Abs: 0.9 10*3/uL (ref 0.7–4.0)
MCH: 29.6 pg (ref 26.0–34.0)
MCHC: 33.2 g/dL (ref 30.0–36.0)
MCV: 89.3 fL (ref 80.0–100.0)
Monocytes Absolute: 0.2 10*3/uL (ref 0.1–1.0)
Monocytes Relative: 9 %
Neutro Abs: 1 10*3/uL — ABNORMAL LOW (ref 1.7–7.7)
Neutrophils Relative %: 45 %
Platelets: 50 10*3/uL — ABNORMAL LOW (ref 150–400)
RBC: 4.22 MIL/uL (ref 4.22–5.81)
RDW: 12.4 % (ref 11.5–15.5)
Smear Review: NORMAL
WBC: 2.1 10*3/uL — ABNORMAL LOW (ref 4.0–10.5)
nRBC: 0 % (ref 0.0–0.2)

## 2022-11-24 LAB — HEPATIC FUNCTION PANEL
ALT: 50 U/L — ABNORMAL HIGH (ref 0–44)
AST: 86 U/L — ABNORMAL HIGH (ref 15–41)
Albumin: 3.1 g/dL — ABNORMAL LOW (ref 3.5–5.0)
Alkaline Phosphatase: 41 U/L (ref 38–126)
Bilirubin, Direct: <0.1 mg/dL (ref 0.0–0.2)
Total Bilirubin: 0.4 mg/dL (ref 0.3–1.2)
Total Protein: 5.5 g/dL — ABNORMAL LOW (ref 6.5–8.1)

## 2022-11-24 LAB — BASIC METABOLIC PANEL
Anion gap: 10 (ref 5–15)
BUN: 29 mg/dL — ABNORMAL HIGH (ref 8–23)
CO2: 21 mmol/L — ABNORMAL LOW (ref 22–32)
Calcium: 7.9 mg/dL — ABNORMAL LOW (ref 8.9–10.3)
Chloride: 109 mmol/L (ref 98–111)
Creatinine, Ser: 1.4 mg/dL — ABNORMAL HIGH (ref 0.61–1.24)
GFR, Estimated: 52 mL/min — ABNORMAL LOW (ref >60)
Glucose, Bld: 72 mg/dL (ref 70–99)
Potassium: 2.9 mmol/L — ABNORMAL LOW (ref 3.5–5.1)
Sodium: 140 mmol/L (ref 135–145)

## 2022-11-24 LAB — MAGNESIUM: Magnesium: 2 mg/dL (ref 1.7–2.4)

## 2022-11-24 MED ORDER — POTASSIUM CHLORIDE CRYS ER 20 MEQ PO TBCR
40.0000 meq | EXTENDED_RELEASE_TABLET | Freq: Once | ORAL | Status: AC
  Administered 2022-11-24: 40 meq via ORAL
  Filled 2022-11-24: qty 2

## 2022-11-24 MED ORDER — LOPERAMIDE HCL 2 MG PO CAPS: 2.0000 mg | ORAL_CAPSULE | ORAL | Status: DC | PRN

## 2022-11-24 NOTE — Progress Notes (Signed)
Progress Note   Patient: Vincent Small WGN:[id number] DOB: [birth year] DOA: 11/23/2022     1 DOS: the patient was seen and examined on 11/24/2022   Brief hospital course: Vincent Small is a is a 77 year old male with history of hypertension, non-insulin-dependent diabetes mellitus, hyperlipidemia, GERD, who presents to the emergency department for chief concerns of diarrhea, generalized bodyaches.  Wife recently removed a tick from him.  Vitals in the ED showed Tmax of 100.8, respiration rate of 20, heart rate 74, blood pressure 113/67, SpO2 of 99% on room air.  Serum sodium is 134, potassium 3.7, chloride 99, bicarb 21, BUN of 41, serum creatinine of 2.17, EGFR 31, nonfasting glucose 92, WBC 2.4, hemoglobin 15.3, platelets of 59.  CT abd/pel wo contrast: Colonic diverticulosis, no bowel obstruction, free air.  Several abdominal lymph nodes along the left pelvic sidewall and left inguinal region.  Some areas are obscured by the extensive artifact of bilateral hip arthropathy plasties.  Distended gallbladder, question wall thickening although evaluation limited by motion.  Subcutaneous nodule along the left side of the upper abdomen, lower chest.  Broad differentials including benign and aggressive process.  ED treatment: Doxycycline 100 mg p.o. one-time dose, cefepime 2 g IV, metronidazole 500 mg IV, LR 150 mL/h, sodium chloride 1 L bolus x 2 were ordered.  6/13: Afebrile this morning, maximum temperature recorded 100.8 over the past 24-hour.  Worsening leukopenia and thrombocytopenia.  C. difficile PCR and GI pathogen panel negative.  MRSA PCR negative.  Procalcitonin 0.73.  COVID-negative.  Lipase elevated at 128, UA not consistent with UTI.  Renal functions improving with creatinine at 1.4, baseline less than 1.  Hypokalemia with potassium of 2.9 which is being repleted, magnesium at 2. No rash noted by patient.  Assessment and Plan: * Severe sepsis (HCC) Etiology workup in progress Patient meets  severe sepsis criteria with fever, elevated respiration rate, leukopenia, with elevated lactic acid and organ involvement is renal Concern of tickborne illness.  Seems improving, no rash.  PCT elevated Preliminary blood cultures negative.  MRSA PCR negative so vancomycin was discontinued -Continue with cefepime and doxycycline -Monitor CBC  Leukopenia Suspect secondary to sepsis, recheck CBC in a.m.  Thrombocytopenia (HCC) Likely secondary to sepsis.  Small decreased today Repeat CBC in a.m.  Lymph nodes enlarged Likely secondary to acute tickborne/viral illness. Continue with current course of antibiotics, patient will need further investigation if fail to resolve in the next couple of weeks  Essential hypertension Hydrochlorothiazide 12.5 mg p.o. daily and losartan 100 mg daily were not resumed on admission due to acute kidney injury Blood pressure within goal-we will continue to hold antihypertensives at this time Hydralazine 5 mg IV every 6 hours as needed for SBP greater 175, 4 days ordered  Diabetes mellitus type 2, noninsulin dependent (HCC) Home p.o. antiglycemic agents not resumed on admission Insulin SSI with agents coverage ordered Goal inpatient blood glucose levels 140-180  Diarrhea Likely secondary to sepsis.  GI pathogen panel and C. difficile PCR was negative. Diarrhea seems improving -Supportive care  AKI (acute kidney injury) (HCC) Likely secondary to sepsis as improving with IV fluid, current creatinine at 1.4, baseline seems to be less than 1. -Monitor renal function -Avoid nephrotoxins  Hyperlipidemia Home ezetimibe 10 mg daily and red yeast rice were not resumed on admission  GERD (gastroesophageal reflux disease) Patient not taking a home PPI   Subjective: Patient was feeling improved when seen today.  Diarrhea improving.  No nausea or vomiting.  Gradually improving appetite.  Physical Exam: Vitals:   11/24/22 0009 11/24/22 0502 11/24/22 0737  11/24/22 1154  BP: 99/86 128/70 138/77 105/71  Pulse: 78 78 86 74  Resp: 18 18 18 18   Temp: 98.6 F (37 C) 98.6 F (37 C) 98.1 F (36.7 C) 98.3 F (36.8 C)  TempSrc: Oral Oral Oral   SpO2: 100% 97% 100% 97%  Weight:      Height:       General.  Well-developed elderly man, in no acute distress. Pulmonary.  Lungs clear bilaterally, normal respiratory effort. CV.  Regular rate and rhythm, no JVD, rub or murmur. Abdomen.  Soft, nontender, nondistended, BS positive. CNS.  Alert and oriented .  No focal neurologic deficit. Extremities.  No edema, no cyanosis, pulses intact and symmetrical. Psychiatry.  Judgment and insight appears normal.   Data Reviewed: Prior data reviewed  Family Communication: Called wife with no response  Disposition: Status is: Inpatient Remains inpatient appropriate because: Severity of illness  Planned Discharge Destination: Home  Time spent: 50 minutes  This record has been created using Conservation officer, historic buildings. Errors have been sought and corrected,but may not always be located. Such creation errors do not reflect on the standard of care.   Author: Arnetha Courser, MD 11/24/2022 1:50 PM  For on call review [email or web address].

## 2022-11-25 DIAGNOSIS — D703 Neutropenia due to infection: Secondary | ICD-10-CM | POA: Diagnosis not present

## 2022-11-25 DIAGNOSIS — R599 Enlarged lymph nodes, unspecified: Secondary | ICD-10-CM | POA: Diagnosis not present

## 2022-11-25 DIAGNOSIS — K219 Gastro-esophageal reflux disease without esophagitis: Secondary | ICD-10-CM

## 2022-11-25 DIAGNOSIS — D696 Thrombocytopenia, unspecified: Secondary | ICD-10-CM | POA: Diagnosis not present

## 2022-11-25 DIAGNOSIS — A419 Sepsis, unspecified organism: Secondary | ICD-10-CM | POA: Diagnosis not present

## 2022-11-25 LAB — BASIC METABOLIC PANEL
Anion gap: 9 (ref 5–15)
BUN: 18 mg/dL (ref 8–23)
CO2: 23 mmol/L (ref 22–32)
Calcium: 8.3 mg/dL — ABNORMAL LOW (ref 8.9–10.3)
Chloride: 109 mmol/L (ref 98–111)
Creatinine, Ser: 0.96 mg/dL (ref 0.61–1.24)
GFR, Estimated: >60 mL/min (ref >60)
Glucose, Bld: 101 mg/dL — ABNORMAL HIGH (ref 70–99)
Potassium: 3.8 mmol/L (ref 3.5–5.1)
Sodium: 141 mmol/L (ref 135–145)

## 2022-11-25 LAB — CBC
HCT: 38.3 % — ABNORMAL LOW (ref 39.0–52.0)
Hemoglobin: 12.7 g/dL — ABNORMAL LOW (ref 13.0–17.0)
MCH: 29.8 pg (ref 26.0–34.0)
MCHC: 33.2 g/dL (ref 30.0–36.0)
MCV: 89.9 fL (ref 80.0–100.0)
Platelets: 55 10*3/uL — ABNORMAL LOW (ref 150–400)
RBC: 4.26 MIL/uL (ref 4.22–5.81)
RDW: 12.5 % (ref 11.5–15.5)
WBC: 2.1 10*3/uL — ABNORMAL LOW (ref 4.0–10.5)
nRBC: 0 % (ref 0.0–0.2)

## 2022-11-25 MED ORDER — DOXYCYCLINE HYCLATE 100 MG PO TABS: 100.0000 mg | ORAL_TABLET | Freq: Two times a day (BID) | ORAL | 0 refills | Status: AC

## 2022-11-25 NOTE — Assessment & Plan Note (Signed)
Resolved with IV fluid Likely secondary to sepsis as improving with IV fluid, -Monitor renal function -Avoid nephrotoxins

## 2022-11-25 NOTE — Discharge Summary (Signed)
Physician Discharge Summary   Patient: Vincent Small MRN: [id number] DOB: [birth year]  Admit date:     11/23/2022  Discharge date: 11/25/22  Discharge Physician: Arnetha Courser   PCP: Marisue Ivan, MD   Recommendations at discharge:  Please obtain CBC and BMP in 3 to 4 days Follow-up with primary care provider early next week Please ensure the completion of antibiotic  Discharge Diagnoses: Principal Problem:   Severe sepsis (HCC) Active Problems:   Leukopenia   Thrombocytopenia (HCC)   Lymph nodes enlarged   Essential hypertension   Diabetes mellitus type 2, noninsulin dependent (HCC)   Diarrhea   AKI (acute kidney injury) (HCC)   Hyperlipidemia   GERD (gastroesophageal reflux disease)   Osteoarthritis of left hip   Hospital Course: Vincent Small is a is a 77 year old male with history of hypertension, non-insulin-dependent diabetes mellitus, hyperlipidemia, GERD, who presents to the emergency department for chief concerns of diarrhea, generalized bodyaches.  Wife recently removed a tick from him.  Vitals in the ED showed Tmax of 100.8, respiration rate of 20, heart rate 74, blood pressure 113/67, SpO2 of 99% on room air.  Serum sodium is 134, potassium 3.7, chloride 99, bicarb 21, BUN of 41, serum creatinine of 2.17, EGFR 31, nonfasting glucose 92, WBC 2.4, hemoglobin 15.3, platelets of 59.  CT abd/pel wo contrast: Colonic diverticulosis, no bowel obstruction, free air.  Several abdominal lymph nodes along the left pelvic sidewall and left inguinal region.  Some areas are obscured by the extensive artifact of bilateral hip arthropathy plasties.  Distended gallbladder, question wall thickening although evaluation limited by motion.  Subcutaneous nodule along the left side of the upper abdomen, lower chest.  Broad differentials including benign and aggressive process.  ED treatment: Doxycycline 100 mg p.o. one-time dose, cefepime 2 g IV, metronidazole 500 mg IV, LR 150 mL/h,  sodium chloride 1 L bolus x 2 were ordered.  6/13: Afebrile this morning, maximum temperature recorded 100.8 over the past 24-hour.  Worsening leukopenia and thrombocytopenia.  C. difficile PCR and GI pathogen panel negative.  MRSA PCR negative.  Procalcitonin 0.73.  COVID-negative.  Lipase elevated at 128, UA not consistent with UTI.  Renal functions improving with creatinine at 1.4, baseline less than 1.  Hypokalemia with potassium of 2.9 which is being repleted, magnesium at 2. No rash noted by patient.  6/14: Remained afebrile and clinically improving.  Stable leukopenia with some improvement in thrombocytopenia.  Diarrhea resolved.  AKI resolved.  Likely having tickborne illness.  Patient is being discharged on 5 days of doxycycline.  Blood pressure started improving so he will resume his home antihypertensives.  Patient will need to follow-up with primary care provider within next few days and have his blood levels repeated.  He will continue on current medications and need to have a close follow-up with his providers.  Assessment and Plan: * Severe sepsis (HCC) Etiology workup in progress Patient meets severe sepsis criteria with fever, elevated respiration rate, leukopenia, with elevated lactic acid and organ involvement is renal Concern of tickborne illness.  Seems improving, no rash.  PCT elevated Preliminary blood cultures negative.  MRSA PCR negative so vancomycin was discontinued -Continue with cefepime and doxycycline -Monitor CBC  Leukopenia Suspect secondary to sepsis, recheck CBC in a.m.  Thrombocytopenia (HCC) Likely secondary to sepsis.  Small decreased today Repeat CBC in a.m.  Lymph nodes enlarged Likely secondary to acute tickborne/viral illness. Continue with current course of antibiotics, patient will need further investigation if fail  to resolve in the next couple of weeks  Essential hypertension Hydrochlorothiazide 12.5 mg p.o. daily and losartan 100 mg daily  were not resumed on admission due to acute kidney injury Blood pressure within goal-we will continue to hold antihypertensives at this time Hydralazine 5 mg IV every 6 hours as needed for SBP greater 175, 4 days ordered  Diabetes mellitus type 2, noninsulin dependent (HCC) Home p.o. antiglycemic agents not resumed on admission Insulin SSI with agents coverage ordered Goal inpatient blood glucose levels 140-180  Diarrhea Likely secondary to sepsis.  GI pathogen panel and C. difficile PCR was negative. Diarrhea resolved -Supportive care  AKI (acute kidney injury) (HCC) Resolved with IV fluid Likely secondary to sepsis as improving with IV fluid, -Monitor renal function -Avoid nephrotoxins  Hyperlipidemia Home ezetimibe 10 mg daily and red yeast rice were not resumed on admission  GERD (gastroesophageal reflux disease) Patient not taking a home PPI   Consultants: None Procedures performed: None Disposition: Home Diet recommendation:  Discharge Diet Orders (From admission, onward)     Start     Ordered   11/25/22 0000  Diet - low sodium heart healthy        11/25/22 1205           Cardiac diet DISCHARGE MEDICATION: Allergies as of 11/25/2022       Reactions   Statins    Muscle Pain        Medication List     STOP taking these medications    docusate sodium 100 MG capsule Commonly known as: COLACE   enoxaparin 40 MG/0.4ML injection Commonly known as: LOVENOX   ondansetron 4 MG tablet Commonly known as: ZOFRAN   traMADol 50 MG tablet Commonly known as: ULTRAM       TAKE these medications    acetaminophen 500 MG tablet Commonly known as: TYLENOL Take 2 tablets (1,000 mg total) by mouth every 8 (eight) hours. What changed:  when to take this reasons to take this   doxycycline 100 MG tablet Commonly known as: VIBRA-TABS Take 1 tablet (100 mg total) by mouth 2 (two) times daily for 5 days.   ezetimibe 10 MG tablet Commonly known as:  ZETIA Take 10 mg by mouth daily.   glimepiride 2 MG tablet Commonly known as: AMARYL Take 2 mg by mouth every morning.   hydrochlorothiazide 12.5 MG capsule Commonly known as: MICROZIDE Take 12.5 mg by mouth daily.   losartan 100 MG tablet Commonly known as: COZAAR Take 100 mg by mouth daily.   OVER THE COUNTER MEDICATION Take 1 tablet by mouth as needed. bio-fisetin otc supplement   Red Yeast Rice 600 MG Caps Take 1,200 mg by mouth 2 (two) times a week.   timolol 0.5 % ophthalmic solution Commonly known as: BETIMOL Place 1 drop into both eyes 2 (two) times a week.        Follow-up Information     Marisue Ivan, MD. Schedule an appointment as soon as possible for a visit in 3 day(s).   Specialty: Family Medicine Contact information: [address] [facility] [place] [address] [phone]                Discharge Exam: Ceasar Mons Weights   11/23/22 1147  Weight: 68 kg   General.  Well-developed elderly man, in no acute distress. Pulmonary.  Lungs clear bilaterally, normal respiratory effort. CV.  Regular rate and rhythm, no JVD, rub or murmur. Abdomen.  Soft, nontender, nondistended, BS positive.  CNS.  Alert and oriented .  No focal neurologic deficit. Extremities.  No edema, no cyanosis, pulses intact and symmetrical. Psychiatry.  Judgment and insight appears normal.   Condition at discharge: stable  The results of significant diagnostics from this hospitalization (including imaging, microbiology, ancillary and laboratory) are listed below for reference.   Imaging Studies: US ABDOMEN LIMITED RUQ (LIVER/GB)  Result Date: 11/23/2022 CLINICAL DATA:  Sepsis EXAM: ULTRASOUND ABDOMEN LIMITED RIGHT UPPER QUADRANT COMPARISON:  CT 11/23/2018 FINDINGS: Gallbladder: Dilated gallbladder. No wall thickening or adjacent fluid. No stones. Slight sludge. Common bile duct: Diameter: 2 mm Liver: No focal lesion identified. Within normal limits in  parenchymal echogenicity. Portal vein is patent on color Doppler imaging with normal direction of blood flow towards the liver. Other: None. IMPRESSION: Dilated gallbladder. Mild sludge. No shadowing stones. No biliary ductal dilatation Electronically Signed   By: Karen Kays M.D.   On: 11/23/2022 15:29   CT ABDOMEN PELVIS WO CONTRAST  Result Date: 11/23/2022 CLINICAL DATA:  Sepsis.  Fever, diarrhea and muscle cramping EXAM: CT ABDOMEN AND PELVIS WITHOUT CONTRAST TECHNIQUE: Multidetector CT imaging of the abdomen and pelvis was performed following the standard protocol without IV contrast. RADIATION DOSE REDUCTION: This exam was performed according to the departmental dose-optimization program which includes automated exposure control, adjustment of the mA and/or kV according to patient size and/or use of iterative reconstruction technique. COMPARISON:  None Available. FINDINGS: Lower chest: Slight linear opacity left lung base likely scar or atelectasis. No pleural effusion. Coronary artery calcifications are seen. Prominent epicardial fat. Hepatobiliary: No IV contrast. Posterior medial right hepatic lobe presumed calcified granuloma. Gallbladder is distended. There is also some possible wall thickening and stranding. Pancreas: Moderate atrophy of the pancreas.  No obvious mass. Spleen: Normal in size without focal abnormality. Small splenule at the hilum. Adrenals/Urinary Tract: Adrenal glands are preserved. No abnormal calcifications seen within either kidney nor along the course of either ureter. Portions of the bladder and distal ureter obscured by the extensive streak artifact from the bilateral hip arthroplasties. Bladder is grossly nondilated. Stomach/Bowel: No oral contrast. Redundant course to the sigmoid colon. Large bowel has a normal course and caliber overall. Scattered colonic diverticula greatest in the transverse and descending and sigmoid colon. Normal appendix extends inferior to the cecum  in the right hemipelvis. Stomach is contracted. Small bowel is nondilated. Vascular/Lymphatic: Scattered vascular calcifications. Normal caliber aorta and IVC. There are abnormal lymph nodes along the left iliac chain. Example series 2, image 66 measures 20 by 11 mm. Several other lesions are seen. There is adjacent stranding as well. Some of these areas are obscured by the artifact from the hip arthroplasties. Additional left inguinal node on series 2, image 85 measures 18 by 13 mm. Reproductive: Obscured. Other: No free air or free fluid. Nonspecific retroperitoneal perinephric stranding. Diffuse motion. Musculoskeletal: Bilateral hip arthroplasties with degenerative changes. Moderate degenerative changes of the spine with curvature. Subcutaneous nodule along the left side of the level of the diaphragm on image 13 measuring 11 mm. This has a differential including a soft tissue true mass lesion versus sebaceous cyst. Please correlate for any known history and dedicated evaluation when appropriate IMPRESSION: Colonic diverticulosis.  No bowel obstruction, free air. Several abnormal lymph nodes along the left pelvic sidewall and left inguinal region. Some of the areas are obscured by the extensive artifact from the bilateral hip arthroplasties. An aggressive process is in the differential recommend further evaluation. Distended gallbladder. Question some wall thickening although evaluation  limited by motion. If there is concern of gallbladder pathology, an ultrasound is recommended as the next step in the workup. Subcutaneous nodule along the left side of the upper abdomen, lower chest. This a broad differential including benign and aggressive process. Please correlate with clinical findings. Additional evaluation otherwise when clinically appropriate Electronically Signed   By: Karen Kays M.D.   On: 11/23/2022 14:09   DG Chest Port 1 View  Result Date: 11/23/2022 CLINICAL DATA:  Sepsis. EXAM: PORTABLE CHEST 1  VIEW COMPARISON:  05/13/2009. FINDINGS: Low lung volumes accentuate the pulmonary vasculature and cardiomediastinal silhouette. No consolidation or pulmonary edema. No pleural effusion or pneumothorax. No acute bony findings. IMPRESSION: Low lung volumes without evidence of acute cardiopulmonary disease. Electronically Signed   By: Orvan Falconer M.D.   On: 11/23/2022 12:52    Microbiology: Results for orders placed or performed during the hospital encounter of 11/23/22  Culture, blood (Routine x 2)     Status: None (Preliminary result)   Collection Time: 11/23/22 12:03 PM   Specimen: BLOOD  Result Value Ref Range Status   Specimen Description BLOOD RIGHT ANTECUBITAL  Final   Special Requests   Final    BOTTLES DRAWN AEROBIC AND ANAEROBIC Blood Culture adequate volume   Culture   Final    NO GROWTH 2 DAYS Performed at [facility], [address], [place], [place] [address]    Report Status PENDING  Incomplete  Culture, blood (Routine x 2)     Status: None (Preliminary result)   Collection Time: 11/23/22 12:03 PM   Specimen: BLOOD  Result Value Ref Range Status   Specimen Description BLOOD LEFT ANTECUBITAL  Final   Special Requests   Final    BOTTLES DRAWN AEROBIC AND ANAEROBIC Blood Culture results may not be optimal due to an inadequate volume of blood received in culture bottles   Culture   Final    NO GROWTH 2 DAYS Performed at [facility], [address] [place], [place] [address]    Report Status PENDING  Incomplete  Resp panel by RT-PCR (RSV, Flu A&B, Covid) Anterior Nasal Swab     Status: None   Collection Time: 11/23/22 12:04 PM   Specimen: Anterior Nasal Swab  Result Value Ref Range Status   SARS Coronavirus 2 by RT PCR NEGATIVE NEGATIVE Final    Comment: (NOTE) SARS-CoV-2 target nucleic acids are NOT DETECTED.  The SARS-CoV-2 RNA is generally detectable in upper respiratory specimens during the acute phase of infection. The lowest concentration  of SARS-CoV-2 viral copies this assay can detect is 138 copies/mL. A negative result does not preclude SARS-Cov-2 infection and should not be used as the sole basis for treatment or other patient management decisions. A negative result may occur with  improper specimen collection/handling, submission of specimen other than nasopharyngeal swab, presence of viral mutation(s) within the areas targeted by this assay, and inadequate number of viral copies(<138 copies/mL). A negative result must be combined with clinical observations, patient history, and epidemiological information. The expected result is Negative.  Fact Sheet for Patients:  BloggerCourse.com  Fact Sheet for Healthcare Providers:  SeriousBroker.it  This test is no t yet approved or cleared by the [place] FDA and  has been authorized for detection and/or diagnosis of SARS-CoV-2 by FDA under an Emergency Use Authorization (EUA). This EUA will remain  in effect (meaning this test can be used) for the duration of the COVID-19 declaration under Section 564(b)(1) of the Act, 21  U.S.C.section 360bbb-3(b)(1), unless the authorization is terminated  or revoked sooner.       Influenza A by PCR NEGATIVE NEGATIVE Final   Influenza B by PCR NEGATIVE NEGATIVE Final    Comment: (NOTE) The Xpert Xpress SARS-CoV-2/FLU/RSV plus assay is intended as an aid in the diagnosis of influenza from Nasopharyngeal swab specimens and should not be used as a sole basis for treatment. Nasal washings and aspirates are unacceptable for Xpert Xpress SARS-CoV-2/FLU/RSV testing.  Fact Sheet for Patients: BloggerCourse.com  Fact Sheet for Healthcare Providers: SeriousBroker.it  This test is not yet approved or cleared by the [place] FDA and has been authorized for detection and/or diagnosis of SARS-CoV-2 by FDA under an Emergency Use  Authorization (EUA). This EUA will remain in effect (meaning this test can be used) for the duration of the COVID-19 declaration under Section 564(b)(1) of the Act, 21 U.S.C. section 360bbb-3(b)(1), unless the authorization is terminated or revoked.     Resp Syncytial Virus by PCR NEGATIVE NEGATIVE Final    Comment: (NOTE) Fact Sheet for Patients: BloggerCourse.com  Fact Sheet for Healthcare Providers: SeriousBroker.it  This test is not yet approved or cleared by the [place] FDA and has been authorized for detection and/or diagnosis of SARS-CoV-2 by FDA under an Emergency Use Authorization (EUA). This EUA will remain in effect (meaning this test can be used) for the duration of the COVID-19 declaration under Section 564(b)(1) of the Act, 21 U.S.C. section 360bbb-3(b)(1), unless the authorization is terminated or revoked.  Performed at [facility], [address] [place], [place] [address]   MRSA Next Gen by PCR, Nasal     Status: None   Collection Time: 11/23/22  4:18 PM   Specimen: Nasal Mucosa; Nasal Swab  Result Value Ref Range Status   MRSA by PCR Next Gen NOT DETECTED NOT DETECTED Final    Comment: (NOTE) The GeneXpert MRSA Assay (FDA approved for NASAL specimens only), is one component of a comprehensive MRSA colonization surveillance program. It is not intended to diagnose MRSA infection nor to guide or monitor treatment for MRSA infections. Test performance is not FDA approved in patients less than 29 years old. Performed at [facility], [address] [place], [place] [address]   C Difficile Quick Screen w PCR reflex     Status: None   Collection Time: 11/23/22  6:47 PM   Specimen: STOOL  Result Value Ref Range Status   C Diff antigen NEGATIVE NEGATIVE Final   C Diff toxin NEGATIVE NEGATIVE Final   C Diff interpretation No C. difficile detected.  Final    Comment: Performed at  [facility], [address] [place], [place] [address]  Gastrointestinal Panel by PCR , Stool     Status: None   Collection Time: 11/23/22  6:47 PM   Specimen: Stool  Result Value Ref Range Status   Campylobacter species NOT DETECTED NOT DETECTED Final   Plesimonas shigelloides NOT DETECTED NOT DETECTED Final   Salmonella species NOT DETECTED NOT DETECTED Final   Yersinia enterocolitica NOT DETECTED NOT DETECTED Final   Vibrio species NOT DETECTED NOT DETECTED Final   Vibrio cholerae NOT DETECTED NOT DETECTED Final   Enteroaggregative E coli (EAEC) NOT DETECTED NOT DETECTED Final   Enteropathogenic E coli (EPEC) NOT DETECTED NOT DETECTED Final   Enterotoxigenic E coli (ETEC) NOT DETECTED NOT DETECTED Final   Shiga like toxin producing E coli (STEC) NOT DETECTED NOT DETECTED Final   Shigella/Enteroinvasive E coli (  EIEC) NOT DETECTED NOT DETECTED Final   Cryptosporidium NOT DETECTED NOT DETECTED Final   Cyclospora cayetanensis NOT DETECTED NOT DETECTED Final   Entamoeba histolytica NOT DETECTED NOT DETECTED Final   Giardia lamblia NOT DETECTED NOT DETECTED Final   Adenovirus F40/41 NOT DETECTED NOT DETECTED Final   Astrovirus NOT DETECTED NOT DETECTED Final   Norovirus GI/GII NOT DETECTED NOT DETECTED Final   Rotavirus A NOT DETECTED NOT DETECTED Final   Sapovirus (I, II, IV, and V) NOT DETECTED NOT DETECTED Final    Comment: Performed at [facility], [address] [place], [place] [address]    Labs: CBC: Recent Labs  Lab 11/23/22 1203 11/24/22 0458 11/25/22 0612  WBC 2.4* 2.1* 2.1*  NEUTROABS 1.3* 1.0*  --   HGB 15.3 12.5* 12.7*  HCT 45.9 37.7* 38.3*  MCV 90.2 89.3 89.9  PLT 59* 50* 55*   Basic Metabolic Panel: Recent Labs  Lab 11/23/22 1203 11/24/22 0458 11/25/22 0612  NA 134* 140 141  K 3.7 2.9* 3.8  CL 99 109 109  CO2 21* 21* 23  GLUCOSE 92 72 101*  BUN 41* 29* 18  CREATININE 2.17* 1.40* 0.96  CALCIUM 8.4* 7.9* 8.3*  MG  --  2.0  --     Liver Function Tests: Recent Labs  Lab 11/23/22 1203 11/24/22 0458  AST 124* 86*  ALT 71* 50*  ALKPHOS 55 41  BILITOT 1.0 0.4  PROT 7.2 5.5*  ALBUMIN 4.2 3.1*   CBG: Recent Labs  Lab 11/23/22 1157  GLUCAP 95    Discharge time spent: greater than 30 minutes.  This record has been created using Conservation officer, historic buildings. Errors have been sought and corrected,but may not always be located. Such creation errors do not reflect on the standard of care.   Signed: Arnetha Courser, MD [facility] 11/25/2022

## 2022-11-25 NOTE — Assessment & Plan Note (Signed)
Likely secondary to sepsis.  GI pathogen panel and C. difficile PCR was negative. Diarrhea resolved -Supportive care

## 2022-11-25 NOTE — Plan of Care (Signed)

## 2022-11-25 NOTE — Progress Notes (Signed)
AVS discussed and provided to patient. IV removed. No complaints at this time.  

## 2022-11-28 LAB — CULTURE, BLOOD (ROUTINE X 2)
Culture: NO GROWTH
Culture: NO GROWTH
Special Requests: ADEQUATE

## 2023-05-29 ENCOUNTER — Emergency Department
Admission: EM | Admit: 2023-05-29 | Discharge: 2023-05-29 | Discharge status: Against Medical Advice | Payer: Medicare Other | Attending: Student | Admitting: Student

## 2023-05-29 ENCOUNTER — Other Ambulatory Visit: Payer: Self-pay

## 2023-05-29 ENCOUNTER — Encounter: Payer: Self-pay | Admitting: Emergency Medicine

## 2023-05-29 ENCOUNTER — Emergency Department: Payer: Medicare Other

## 2023-05-29 DIAGNOSIS — R0781 Pleurodynia: Secondary | ICD-10-CM | POA: Insufficient documentation

## 2023-05-29 DIAGNOSIS — R0602 Shortness of breath: Secondary | ICD-10-CM | POA: Insufficient documentation

## 2023-05-29 DIAGNOSIS — W109XXA Fall (on) (from) unspecified stairs and steps, initial encounter: Secondary | ICD-10-CM | POA: Diagnosis not present

## 2023-05-29 DIAGNOSIS — Z5321 Procedure and treatment not carried out due to patient leaving prior to being seen by health care provider: Secondary | ICD-10-CM | POA: Insufficient documentation

## 2023-05-29 NOTE — ED Provider Triage Note (Signed)
Emergency Medicine Provider Triage Evaluation Note  Vincent Small , a 77 y.o. male  was evaluated in triage.  Pt complains of mechanical fall about 2 hours ago. Landed on his right side, now having difficulty breathing. Did not hit his head, no LOC.  Review of Systems  Positive: Right rib pain, difficulty breathing Negative:   Physical Exam  There were no vitals taken for this visit. Gen:   Awake, no distress   Resp:  Normal effort  MSK:   Moves extremities without difficulty  Other:    Medical Decision Making  Medically screening exam initiated at 2:13 PM.  Appropriate orders placed.  Vincent Small was informed that the remainder of the evaluation will be completed by another provider, this initial triage assessment does not replace that evaluation, and the importance of remaining in the ED until their evaluation is complete.     Cameron Ali, PA-C 05/29/23 1415

## 2023-05-29 NOTE — ED Triage Notes (Signed)
Patient to ED via POV after a fall. Patient states he was walking and tripped down 1 brick step on his right side. States he has not bene able to take a deep breathe  since. Denies hitting head.

## 2023-05-29 NOTE — ED Notes (Signed)
First Nurse Note: Pt to ED via [facility]. Pt fell and hit his right ribs on steps and is now having shortness of breath. SpO2 100%.

## 2023-05-30 ENCOUNTER — Telehealth: Payer: Self-pay | Admitting: Medical Oncology

## 2023-05-30 NOTE — Telephone Encounter (Signed)
Unsuccessful attempt to speak with patient regarding his CXR results that showed rib fx. VM left. MD Derrill Kay advised that pt should f/u with PCP.
